# Patient Record
Sex: Male | Born: 1960 | Race: White | Hispanic: No | Marital: Married | Smoking: Never smoker
Health system: Southern US, Community
[De-identification: ages and names within clinical notes are randomized; demographics above are authoritative.]

## PROBLEM LIST (undated history)

## (undated) DIAGNOSIS — Z973 Presence of spectacles and contact lenses: Secondary | ICD-10-CM

## (undated) DIAGNOSIS — I10 Essential (primary) hypertension: Secondary | ICD-10-CM

## (undated) HISTORY — PX: HERNIA REPAIR: SHX51

## (undated) HISTORY — DX: Essential (primary) hypertension: I10

## (undated) HISTORY — PX: FRACTURE SURGERY: SHX138

---

## 1992-08-28 HISTORY — PX: WISDOM TOOTH EXTRACTION: SHX21

## 2014-01-11 ENCOUNTER — Emergency Department (HOSPITAL_COMMUNITY): Payer: Worker's Compensation

## 2014-01-11 ENCOUNTER — Emergency Department (HOSPITAL_COMMUNITY)
Admission: EM | Admit: 2014-01-11 | Discharge: 2014-01-11 | Disposition: A | Discharge status: Home / Self Care | Payer: Worker's Compensation | Attending: Emergency Medicine | Admitting: Emergency Medicine

## 2014-01-11 ENCOUNTER — Encounter (HOSPITAL_COMMUNITY): Payer: Self-pay | Admitting: Emergency Medicine

## 2014-01-11 DIAGNOSIS — Y929 Unspecified place or not applicable: Secondary | ICD-10-CM | POA: Insufficient documentation

## 2014-01-11 DIAGNOSIS — S82899A Other fracture of unspecified lower leg, initial encounter for closed fracture: Secondary | ICD-10-CM

## 2014-01-11 DIAGNOSIS — R296 Repeated falls: Secondary | ICD-10-CM | POA: Insufficient documentation

## 2014-01-11 DIAGNOSIS — Y9389 Activity, other specified: Secondary | ICD-10-CM | POA: Insufficient documentation

## 2014-01-11 DIAGNOSIS — R110 Nausea: Secondary | ICD-10-CM | POA: Insufficient documentation

## 2014-01-11 DIAGNOSIS — R55 Syncope and collapse: Secondary | ICD-10-CM

## 2014-01-11 DIAGNOSIS — R42 Dizziness and giddiness: Secondary | ICD-10-CM | POA: Insufficient documentation

## 2014-01-11 DIAGNOSIS — I10 Essential (primary) hypertension: Secondary | ICD-10-CM

## 2014-01-11 DIAGNOSIS — I951 Orthostatic hypotension: Secondary | ICD-10-CM

## 2014-01-11 HISTORY — DX: Essential (primary) hypertension: I10

## 2014-01-11 LAB — CBC WITH DIFFERENTIAL/PLATELET
Basophils Absolute: 0.1 10*3/uL (ref 0.0–0.1)
Basophils Relative: 1 % (ref 0–1)
EOS ABS: 0.2 10*3/uL (ref 0.0–0.7)
Eosinophils Relative: 2 % (ref 0–5)
HCT: 43.8 % (ref 39.0–52.0)
Hemoglobin: 15 g/dL (ref 13.0–17.0)
LYMPHS PCT: 14 % (ref 12–46)
Lymphs Abs: 1.2 10*3/uL (ref 0.7–4.0)
MCH: 30.1 pg (ref 26.0–34.0)
MCHC: 34.2 g/dL (ref 30.0–36.0)
MCV: 87.8 fL (ref 78.0–100.0)
MONO ABS: 0.7 10*3/uL (ref 0.1–1.0)
Monocytes Relative: 8 % (ref 3–12)
Neutro Abs: 6.8 10*3/uL (ref 1.7–7.7)
Neutrophils Relative %: 75 % (ref 43–77)
Platelets: 179 10*3/uL (ref 150–400)
RBC: 4.99 MIL/uL (ref 4.22–5.81)
RDW: 12.5 % (ref 11.5–15.5)
WBC: 9 10*3/uL (ref 4.0–10.5)

## 2014-01-11 LAB — BASIC METABOLIC PANEL
BUN: 14 mg/dL (ref 6–23)
CALCIUM: 9.8 mg/dL (ref 8.4–10.5)
CO2: 23 meq/L (ref 19–32)
Chloride: 100 mEq/L (ref 96–112)
Creatinine, Ser: 0.98 mg/dL (ref 0.50–1.35)
GFR calc Af Amer: >90 mL/min (ref >90)
GLUCOSE: 110 mg/dL — AB (ref 70–99)
Potassium: 4 mEq/L (ref 3.7–5.3)
Sodium: 139 mEq/L (ref 137–147)

## 2014-01-11 LAB — I-STAT TROPONIN, ED: Troponin i, poc: 0 ng/mL (ref 0.00–0.08)

## 2014-01-11 MED ORDER — SODIUM CHLORIDE 0.9 % IV BOLUS (SEPSIS): 1000.0000 mL | Freq: Once | INTRAVENOUS | Status: DC

## 2014-01-11 MED ORDER — HYDROCODONE-ACETAMINOPHEN 5-325 MG PO TABS: 1.0000 | ORAL_TABLET | Freq: Four times a day (QID) | ORAL | Status: DC | PRN

## 2014-01-11 NOTE — ED Notes (Signed)
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

## 2014-01-11 NOTE — Discharge Instructions (Signed)
Ankle Fracture A fracture is a break in the bone. A cast or splint is used to protect and keep your injured bone from moving.  HOME CARE INSTRUCTIONS   Use your crutches as directed.  To lessen the swelling, keep the injured leg elevated while sitting or lying down.  Apply ice to the injury for 15-20 minutes, 03-04 times per day while awake for 2 days. Put the ice in a plastic bag and place a thin towel between the bag of ice and your cast.  If you have a plaster or fiberglass cast:  Do not try to scratch the skin under the cast using sharp or pointed objects.  Check the skin around the cast every day. You may put lotion on any red or sore areas.  Keep your cast dry and clean.  If you have a plaster splint:  Wear the splint as directed.  You may loosen the elastic around the splint if your toes become numb, tingle, or turn cold or blue.  Do not put pressure on any part of your cast or splint; it may break. Rest your cast only on a pillow the first 24 hours until it is fully hardened.  Your cast or splint can be protected during bathing with a plastic bag. Do not lower the cast or splint into water.  Take medications as directed by your caregiver. Only take over-the-counter or prescription medicines for pain, discomfort, or fever as directed by your caregiver.  Do not drive a vehicle until your caregiver specifically tells you it is safe to do so.  If your caregiver has given you a follow-up appointment, it is very important to keep that appointment. Not keeping the appointment could result in a chronic or permanent injury, pain, and disability. If there is any problem keeping the appointment, you must call back to this facility for assistance. SEEK IMMEDIATE MEDICAL CARE IF:   Your cast gets damaged or breaks.  You have continued severe pain or more swelling than you did before the cast was put on.  Your skin or toenails below the injury turn blue or gray, or feel cold or  numb.  There is a bad smell or new stains and/or purulent (pus like) drainage coming from under the cast. If you do not have a window in your cast for observing the wound, a discharge or minor bleeding may show up as a stain on the outside of your cast. Report these findings to your caregiver. MAKE SURE YOU:   Understand these instructions.  Will watch your condition.  Will get help right away if you are not doing well or get worse. Document Released: 08/11/2000 Document Revised: 11/06/2011 Document Reviewed: 03/13/2013 [facility] Patient Information 2014 [place], [place]. Orthostasis Orthostasis is a sudden fall in blood pressure or rise in heartrate. It occurs when a person goes from a sitting or lying position to a standing position. CAUSES   Loss of body fluids (dehydration).  Medicines that lower blood pressure.  Sudden changes in posture, such as sudden standing when you have been sitting or lying down.  Taking too much of your medicine. SYMPTOMS   Lightheadedness or dizziness.  Fainting or near-fainting.  A fast heart rate (tachycardia).  Weakness.  Feeling tired (fatigue). DIAGNOSIS  Your caregiver may find the cause of orthostatic hypotension through:  A history and/or physical exam.  Checking your blood pressure. Your caregiver will check your blood pressure when you are:  Lying down.  Sitting.  Standing.  Tilt table  testing. In this test, you are placed on a table that goes from a lying position to a standing position. You will be strapped to the table. This test helps to monitor your blood pressure and heart rate when you are in different positions. TREATMENT   If orthostatic hypotension is caused by your medicines, your caregiver will need to adjust your dosage. Do not stop or adjust your medicine on your own.  When changing positions, make these changes slowly. This allows your body to adjust to the different position.  Compression stockings that are  worn on your lower legs may be helpful.  Your caregiver may have you consume extra salt. Do not add extra salt to your diet unless directed by your caregiver.  Eat frequent, small meals. Avoid sudden standing after eating.  Avoid hot showers or excessive heat.  Your caregiver may give you fluids through the vein (intravenous).  Your caregiver may put you on medicine to help enhance fluid retention. SEEK IMMEDIATE MEDICAL CARE IF:   You faint or have a near-fainting episode. Call your local emergency services (911 in [place]  You have or develop chest pain.  You feel sick to your stomach (nauseous) or vomit.  You have a loss of feeling or movement in your arms or legs.  You have difficulty talking, slurred speech, or you are unable to talk.  You have difficulty thinking or have confused thinking. MAKE SURE YOU:   Understand these instructions.  Will watch your condition.  Will get help right away if you are not doing well or get worse. Document Released: 08/04/2002 Document Revised: 11/06/2011 Document Reviewed: 11/27/2008 [facility] Patient Information 2014 [place], [place].

## 2014-01-11 NOTE — ED Notes (Signed)
Per pt he stood up from a sitting position and had LOC, pt states he remembers waking up "Face down on the floor."  Pt states nothing like this has ever happened prior to tonight.  Pt has deformity and edema to his left ankle.  Pt rates pain 7/10.

## 2014-01-11 NOTE — ED Provider Notes (Signed)
CSN: [id number]     Arrival date & time 01/11/14  0522 History   First MD Initiated Contact with Patient 01/11/14 0544     Chief Complaint  Patient presents with  . Ankle Injury  . Loss of Consciousness     (Consider location/radiation/quality/duration/timing/severity/associated sxs/prior Treatment) HPI  This a 53 year old male who presents with left ankle pain. Patient states that he went from a sitting to standing position and thinks he passed out. He does not member the event was unwitnessed. He states he felt little nauseous before he stood up. He denies any shortness of breath or chest pain. He does work nights and has felt more fatigued. He denies any dehydration. He states that when he woke up he had deformity and pain to the left ankle.  Patient denies any prior episodes of syncope. He denies any history of seizure. Currently the rates his pain at 7/10. He does not think he hit his head and denies any headache. He is not on any anticoagulants. No known history of heart disease or valvular disease.  History reviewed. No pertinent past medical history. Past Surgical History  Procedure Laterality Date  . Hernia repair      When pt was 53 years old   No family history on file. History  Substance Use Topics  . Smoking status: Never Smoker   . Smokeless tobacco: Never Used  . Alcohol Use: No    Review of Systems  Constitutional: Negative.  Negative for fever.  Respiratory: Negative.  Negative for chest tightness and shortness of breath.   Cardiovascular: Negative.  Negative for chest pain.  Gastrointestinal: Negative.  Negative for abdominal pain.  Genitourinary: Negative.  Negative for dysuria.  Musculoskeletal: Negative for back pain.  Skin: Negative for rash.  Neurological: Positive for dizziness and syncope. Negative for weakness, numbness and headaches.  All other systems reviewed and are negative.     Allergies  Review of patient's allergies indicates no known  allergies.  Home Medications   Prior to Admission medications   Not on File   BP 139/89  Pulse 57  Temp(Src) 97.9 F (36.6 C) (Oral)  Resp 20  Ht 6\' 2"  (1.88 m)  Wt 235 lb (106.595 kg)  BMI 30.16 kg/m2  SpO2 97% Physical Exam  Nursing note and vitals reviewed. Constitutional: He is oriented to person, place, and time. He appears well-developed and well-nourished.  HENT:  Head: Normocephalic and atraumatic.  Eyes: Pupils are equal, round, and reactive to light.  Neck: Neck supple.  Cardiovascular: Normal rate and regular rhythm.   Pulmonary/Chest: Effort normal. No respiratory distress.  Musculoskeletal: He exhibits no edema.  Diffuse swelling left ankle, 2+ DP pulse, limited range of motion secondary to pain, no obvious deformity, no proximal fibular tenderness  Lymphadenopathy:    He has no cervical adenopathy.  Neurological: He is alert and oriented to person, place, and time.  Cranial nerves II through XII intact, 5/5 extremities, no dysmetria to finger-nose-finger  Skin: Skin is warm and dry.  Psychiatric: He has a normal mood and affect.    ED Course  Procedures (including critical care time) Labs Review Labs Reviewed  BASIC METABOLIC PANEL - Abnormal; Notable for the following:    Glucose, Bld 110 (*)    All other components within normal limits  CBC WITH DIFFERENTIAL  I-STAT TROPOININ, ED    Imaging Review Dg Ankle Complete Left  01/11/2014   CLINICAL DATA:  Left ankle pain following a fall today.  EXAM:  LEFT ANKLE COMPLETE - 3+ VIEW  COMPARISON:  None.  FINDINGS: Oblique fracture through the proximal lateral malleolus with 1/3 shaft width of posterior displacement and mild posterior angulation of the distal fragment. There is also widening of the space between the medial malleolus and talus. Associated soft tissue swelling and effusion.  IMPRESSION: Lateral malleolus fracture and distal tibia subluxation, as described above.   Electronically Signed   By: Enrique Sack M.D.   On: 01/11/2014 06:20   Ct Head Wo Contrast  01/11/2014   CLINICAL DATA:  Syncope post trauma  EXAM: CT HEAD WITHOUT CONTRAST  TECHNIQUE: Contiguous axial images were obtained from the base of the skull through the vertex without intravenous contrast.  COMPARISON:  None.  FINDINGS: Ventricles are normal in size and configuration. There is no mass, hemorrhage, extra-axial fluid collection, or midline shift. Gray-white compartments appear normal. No acute infarct apparent. Bony calvarium appears intact. The mastoid air cells are clear.  IMPRESSION: Study within normal limits.   Electronically Signed   By: Lowella Grip M.D.   On: 01/11/2014 08:29     EKG Interpretation   Date/Time:  Sunday Jan 11 2014 05:49:45 EDT Ventricular Rate:  57 PR Interval:  152 QRS Duration: 89 QT Interval:  421 QTC Calculation: 410 R Axis:   12 Text Interpretation:  Sinus rhythm No prior for comparison Confirmed by  HORTON  MD, COURTNEY ([id number]) on 01/11/2014 7:30:41 AM      MDM   Final diagnoses:  Orthostasis  Syncope  Ankle fracture    Patient with reported syncope and left ankle injury.  +orthostasis. EKG reassuring and other work-up unremarkable.  CT head neg.  Patient noted to have right ankle fracture with joint space widening.  Discussed with Dr. Tonita Cong.  Will split and patient to follow-up tomorrow.  GIven orthostasis, patient given fluids.  Suspect this as cause of syncope given story and w/u.  After history, exam, and medical workup I feel the patient has been appropriately medically screened and is safe for discharge home. Pertinent diagnoses were discussed with the patient. Patient was given return precautions.     Merryl Hacker, MD 01/11/14 [phone]

## 2014-01-11 NOTE — ED Provider Notes (Signed)
8:27 AM  Assumed care from Dr. Dina Rich.  Pt is a 53 y.o. M with no PMH who had a syncopal event after standing up from a seated position and sustained a left malleolus fracture and distal tibia subluxation. He has been splinted. His labs have been unremarkable. EKG shows no ischemic changes or arrhythmia. He is orthostatic and receiving IV fluids. CT head pending. Anticipate discharge home.   8:36 AM  Pt's head CT is negative. He is neurologically intact, well-appearing, hemodynamically stable. Patient has asked if he can be discharged without receiving IV fluids. Discussed with patient that he is orthostatic and we recommend IV fluids given his syncopal event but he states he would rather go home and drink water, Gatorade. Have discussed strict return precautions and supportive care instructions. Patient does not become lightheaded with standing and has been able to a bili on crutches. I feel this to be a safe to go home and since he can't tolerate by mouth fluids at this is also a option for rehydration. Patient and wife at bedside are comfortable with this plan.  [place], DO 01/11/14 [phone]

## 2014-01-16 ENCOUNTER — Encounter (HOSPITAL_BASED_OUTPATIENT_CLINIC_OR_DEPARTMENT_OTHER): Payer: Self-pay | Admitting: *Deleted

## 2014-01-16 NOTE — Progress Notes (Signed)
Labs and ekg done er-01/11/14-no meds

## 2014-01-21 ENCOUNTER — Other Ambulatory Visit: Payer: Self-pay | Admitting: Orthopedic Surgery

## 2014-01-22 ENCOUNTER — Encounter (HOSPITAL_BASED_OUTPATIENT_CLINIC_OR_DEPARTMENT_OTHER): Payer: 59 | Admitting: Certified Registered"

## 2014-01-22 ENCOUNTER — Encounter (HOSPITAL_BASED_OUTPATIENT_CLINIC_OR_DEPARTMENT_OTHER)
Admission: RE | Disposition: A | Discharge status: Home / Self Care | Payer: Self-pay | Source: Ambulatory Visit | Attending: Orthopedic Surgery

## 2014-01-22 ENCOUNTER — Ambulatory Visit (HOSPITAL_BASED_OUTPATIENT_CLINIC_OR_DEPARTMENT_OTHER)
Admission: RE | Admit: 2014-01-22 | Discharge: 2014-01-22 | Disposition: A | Discharge status: Home / Self Care | Payer: 59 | Source: Ambulatory Visit | Attending: Orthopedic Surgery | Admitting: Orthopedic Surgery

## 2014-01-22 ENCOUNTER — Ambulatory Visit (HOSPITAL_BASED_OUTPATIENT_CLINIC_OR_DEPARTMENT_OTHER): Payer: 59 | Admitting: Certified Registered"

## 2014-01-22 ENCOUNTER — Encounter (HOSPITAL_BASED_OUTPATIENT_CLINIC_OR_DEPARTMENT_OTHER): Payer: Self-pay | Admitting: Certified Registered"

## 2014-01-22 DIAGNOSIS — Y9269 Other specified industrial and construction area as the place of occurrence of the external cause: Secondary | ICD-10-CM | POA: Diagnosis not present

## 2014-01-22 DIAGNOSIS — S8263XA Displaced fracture of lateral malleolus of unspecified fibula, initial encounter for closed fracture: Secondary | ICD-10-CM | POA: Insufficient documentation

## 2014-01-22 DIAGNOSIS — IMO0001 Reserved for inherently not codable concepts without codable children: Secondary | ICD-10-CM

## 2014-01-22 DIAGNOSIS — Y990 Civilian activity done for income or pay: Secondary | ICD-10-CM | POA: Insufficient documentation

## 2014-01-22 DIAGNOSIS — X58XXXA Exposure to other specified factors, initial encounter: Secondary | ICD-10-CM | POA: Diagnosis not present

## 2014-01-22 HISTORY — PX: ORIF ANKLE FRACTURE: SHX5408

## 2014-01-22 HISTORY — DX: Presence of spectacles and contact lenses: Z97.3

## 2014-01-22 LAB — POCT HEMOGLOBIN-HEMACUE: Hemoglobin: 15.5 g/dL (ref 13.0–17.0)

## 2014-01-22 SURGERY — OPEN REDUCTION INTERNAL FIXATION (ORIF) ANKLE FRACTURE
Anesthesia: Regional | Site: Ankle | Laterality: Left

## 2014-01-22 MED ORDER — HYDROCODONE-ACETAMINOPHEN 5-325 MG PO TABS: 1.0000 | ORAL_TABLET | Freq: Four times a day (QID) | ORAL | Status: DC | PRN

## 2014-01-22 MED ORDER — MIDAZOLAM HCL 2 MG/2ML IJ SOLN
1.0000 mg | INTRAMUSCULAR | Status: DC | PRN
  Administered 2014-01-22: 2 mg via INTRAVENOUS

## 2014-01-22 MED ORDER — FENTANYL CITRATE 0.05 MG/ML IJ SOLN
INTRAMUSCULAR | Status: AC
  Filled 2014-01-22: qty 6

## 2014-01-22 MED ORDER — ACETAMINOPHEN 500 MG PO TABS
1000.0000 mg | ORAL_TABLET | Freq: Once | ORAL | Status: AC
  Administered 2014-01-22: 1000 mg via ORAL

## 2014-01-22 MED ORDER — CEFAZOLIN SODIUM-DEXTROSE 2-3 GM-% IV SOLR
2.0000 g | INTRAVENOUS | Status: AC
  Administered 2014-01-22: 2 g via INTRAVENOUS

## 2014-01-22 MED ORDER — OXYCODONE HCL 5 MG PO TABS: 5.0000 mg | ORAL_TABLET | Freq: Once | ORAL | Status: DC | PRN

## 2014-01-22 MED ORDER — ONDANSETRON HCL 4 MG/2ML IJ SOLN
INTRAMUSCULAR | Status: DC | PRN
  Administered 2014-01-22: 4 mg via INTRAVENOUS

## 2014-01-22 MED ORDER — MIDAZOLAM HCL 2 MG/2ML IJ SOLN
INTRAMUSCULAR | Status: AC
  Filled 2014-01-22: qty 2

## 2014-01-22 MED ORDER — 0.9 % SODIUM CHLORIDE (POUR BTL) OPTIME
TOPICAL | Status: DC | PRN
  Administered 2014-01-22: 200 mL

## 2014-01-22 MED ORDER — LIDOCAINE HCL (CARDIAC) 20 MG/ML IV SOLN
INTRAVENOUS | Status: DC | PRN
  Administered 2014-01-22: 30 mg via INTRAVENOUS

## 2014-01-22 MED ORDER — PROPOFOL 10 MG/ML IV BOLUS
INTRAVENOUS | Status: DC | PRN
  Administered 2014-01-22: 200 mg via INTRAVENOUS

## 2014-01-22 MED ORDER — ACETAMINOPHEN 500 MG PO TABS
ORAL_TABLET | ORAL | Status: AC
  Filled 2014-01-22: qty 2

## 2014-01-22 MED ORDER — HYDROMORPHONE HCL PF 1 MG/ML IJ SOLN: 0.2500 mg | INTRAMUSCULAR | Status: DC | PRN

## 2014-01-22 MED ORDER — CHLORHEXIDINE GLUCONATE 4 % EX LIQD: 60.0000 mL | Freq: Once | CUTANEOUS | Status: DC

## 2014-01-22 MED ORDER — SODIUM CHLORIDE 0.9 % IV SOLN: INTRAVENOUS | Status: DC

## 2014-01-22 MED ORDER — FENTANYL CITRATE 0.05 MG/ML IJ SOLN
INTRAMUSCULAR | Status: AC
  Filled 2014-01-22: qty 2

## 2014-01-22 MED ORDER — CEFAZOLIN SODIUM-DEXTROSE 2-3 GM-% IV SOLR
INTRAVENOUS | Status: AC
  Filled 2014-01-22: qty 50

## 2014-01-22 MED ORDER — MIDAZOLAM HCL 5 MG/5ML IJ SOLN
INTRAMUSCULAR | Status: DC | PRN
  Administered 2014-01-22: 2 mg via INTRAVENOUS

## 2014-01-22 MED ORDER — LACTATED RINGERS IV SOLN
INTRAVENOUS | Status: DC
  Administered 2014-01-22 (×2): via INTRAVENOUS
  Administered 2014-01-22: 10 mL/h via INTRAVENOUS

## 2014-01-22 MED ORDER — BACITRACIN ZINC 500 UNIT/GM EX OINT
TOPICAL_OINTMENT | CUTANEOUS | Status: AC
  Filled 2014-01-22: qty 28.35

## 2014-01-22 MED ORDER — BUPIVACAINE HCL (PF) 0.5 % IJ SOLN
INTRAMUSCULAR | Status: DC | PRN
  Administered 2014-01-22: 15 mL

## 2014-01-22 MED ORDER — MIDAZOLAM HCL 2 MG/2ML IJ SOLN
INTRAMUSCULAR | Status: AC
Start: 2014-01-22 — End: 2014-01-22
  Filled 2014-01-22: qty 2

## 2014-01-22 MED ORDER — DEXAMETHASONE SODIUM PHOSPHATE 10 MG/ML IJ SOLN
INTRAMUSCULAR | Status: DC | PRN
  Administered 2014-01-22: 10 mg via INTRAVENOUS

## 2014-01-22 MED ORDER — BUPIVACAINE-EPINEPHRINE (PF) 0.5% -1:200000 IJ SOLN
INTRAMUSCULAR | Status: AC
Start: 2014-01-22 — End: 2014-01-22
  Filled 2014-01-22: qty 30

## 2014-01-22 MED ORDER — BUPIVACAINE-EPINEPHRINE (PF) 0.5% -1:200000 IJ SOLN
INTRAMUSCULAR | Status: DC | PRN
  Administered 2014-01-22: 30 mL

## 2014-01-22 MED ORDER — OXYCODONE HCL 5 MG/5ML PO SOLN: 5.0000 mg | Freq: Once | ORAL | Status: DC | PRN

## 2014-01-22 MED ORDER — BACITRACIN ZINC 500 UNIT/GM EX OINT
TOPICAL_OINTMENT | CUTANEOUS | Status: DC | PRN
  Administered 2014-01-22: 1 via TOPICAL

## 2014-01-22 MED ORDER — FENTANYL CITRATE 0.05 MG/ML IJ SOLN
50.0000 ug | INTRAMUSCULAR | Status: DC | PRN
  Administered 2014-01-22: 100 ug via INTRAVENOUS

## 2014-01-22 SURGICAL SUPPLY — 77 items
BANDAGE ESMARK 6X9 LF (GAUZE/BANDAGES/DRESSINGS) ×1 IMPLANT
BIT DRILL 2.5X2.75 QC CALB (BIT) ×2 IMPLANT
BIT DRILL 3.5X5.5 QC CALB (BIT) ×2 IMPLANT
BLADE 15 SAFETY STRL DISP (BLADE) IMPLANT
BLADE SURG 15 STRL LF DISP TIS (BLADE) ×2 IMPLANT
BLADE SURG 15 STRL SS (BLADE) ×2
BNDG COHESIVE 4X5 TAN STRL (GAUZE/BANDAGES/DRESSINGS) ×2 IMPLANT
BNDG COHESIVE 6X5 TAN STRL LF (GAUZE/BANDAGES/DRESSINGS) ×2 IMPLANT
BNDG ESMARK 4X9 LF (GAUZE/BANDAGES/DRESSINGS) IMPLANT
BNDG ESMARK 6X9 LF (GAUZE/BANDAGES/DRESSINGS) ×2
CANISTER SUCT 1200ML W/VALVE (MISCELLANEOUS) ×2 IMPLANT
CHLORAPREP W/TINT 26ML (MISCELLANEOUS) ×2 IMPLANT
COVER TABLE BACK 60X90 (DRAPES) ×2 IMPLANT
CUFF TOURNIQUET SINGLE 34IN LL (TOURNIQUET CUFF) ×2 IMPLANT
DECANTER SPIKE VIAL GLASS SM (MISCELLANEOUS) IMPLANT
DRAPE C-ARM 42X72 X-RAY (DRAPES) IMPLANT
DRAPE C-ARMOR (DRAPES) IMPLANT
DRAPE EXTREMITY T 121X128X90 (DRAPE) ×2 IMPLANT
DRAPE OEC MINIVIEW 54X84 (DRAPES) ×2 IMPLANT
DRAPE U-SHAPE 47X51 STRL (DRAPES) ×2 IMPLANT
DRSG ADAPTIC 3X8 NADH LF (GAUZE/BANDAGES/DRESSINGS) IMPLANT
DRSG EMULSION OIL 3X3 NADH (GAUZE/BANDAGES/DRESSINGS) ×2 IMPLANT
DRSG PAD ABDOMINAL 8X10 ST (GAUZE/BANDAGES/DRESSINGS) ×4 IMPLANT
ELECT REM PT RETURN 9FT ADLT (ELECTROSURGICAL) ×2
ELECTRODE REM PT RTRN 9FT ADLT (ELECTROSURGICAL) ×1 IMPLANT
GAUZE SPONGE 4X4 12PLY STRL (GAUZE/BANDAGES/DRESSINGS) ×2 IMPLANT
GLOVE BIO SURGEON STRL SZ8 (GLOVE) ×2 IMPLANT
GLOVE BIOGEL PI IND STRL 7.0 (GLOVE) ×2 IMPLANT
GLOVE BIOGEL PI IND STRL 8 (GLOVE) ×1 IMPLANT
GLOVE BIOGEL PI INDICATOR 7.0 (GLOVE) ×2
GLOVE BIOGEL PI INDICATOR 8 (GLOVE) ×1
GLOVE ECLIPSE 6.5 STRL STRAW (GLOVE) ×4 IMPLANT
GLOVE EXAM NITRILE MD LF STRL (GLOVE) ×2 IMPLANT
GOWN STRL REUS W/ TWL LRG LVL3 (GOWN DISPOSABLE) ×2 IMPLANT
GOWN STRL REUS W/ TWL XL LVL3 (GOWN DISPOSABLE) ×1 IMPLANT
GOWN STRL REUS W/TWL LRG LVL3 (GOWN DISPOSABLE) ×2
GOWN STRL REUS W/TWL XL LVL3 (GOWN DISPOSABLE) ×1
NEEDLE HYPO 22GX1.5 SAFETY (NEEDLE) IMPLANT
NS IRRIG 1000ML POUR BTL (IV SOLUTION) ×2 IMPLANT
PACK BASIN DAY SURGERY FS (CUSTOM PROCEDURE TRAY) ×2 IMPLANT
PAD CAST 4YDX4 CTTN HI CHSV (CAST SUPPLIES) ×1 IMPLANT
PADDING CAST ABS 4INX4YD NS (CAST SUPPLIES)
PADDING CAST ABS COTTON 4X4 ST (CAST SUPPLIES) IMPLANT
PADDING CAST COTTON 4X4 STRL (CAST SUPPLIES) ×1
PADDING CAST COTTON 6X4 STRL (CAST SUPPLIES) ×2 IMPLANT
PENCIL BUTTON HOLSTER BLD 10FT (ELECTRODE) ×2 IMPLANT
PLATE ACE 100DEG 6HOLE (Plate) ×2 IMPLANT
SANITIZER HAND PURELL 535ML FO (MISCELLANEOUS) ×2 IMPLANT
SCREW CORTICAL 3.5MM  16MM (Screw) ×1 IMPLANT
SCREW CORTICAL 3.5MM  20MM (Screw) ×3 IMPLANT
SCREW CORTICAL 3.5MM 14MM (Screw) ×2 IMPLANT
SCREW CORTICAL 3.5MM 16MM (Screw) ×1 IMPLANT
SCREW CORTICAL 3.5MM 18MM (Screw) ×2 IMPLANT
SCREW CORTICAL 3.5MM 20MM (Screw) ×3 IMPLANT
SCREW CORTICAL 3.5MM 24MM (Screw) ×2 IMPLANT
SHEET MEDIUM DRAPE 40X70 STRL (DRAPES) ×2 IMPLANT
SLEEVE SCD COMPRESS KNEE MED (MISCELLANEOUS) ×2 IMPLANT
SPLINT FAST PLASTER 5X30 (CAST SUPPLIES) ×20
SPLINT PLASTER CAST FAST 5X30 (CAST SUPPLIES) ×20 IMPLANT
SPONGE LAP 18X18 X RAY DECT (DISPOSABLE) ×2 IMPLANT
STOCKINETTE 6  STRL (DRAPES) ×1
STOCKINETTE 6 STRL (DRAPES) ×1 IMPLANT
SUCTION FRAZIER TIP 10 FR DISP (SUCTIONS) ×2 IMPLANT
SUT ETHILON 3 0 PS 1 (SUTURE) ×2 IMPLANT
SUT FIBERWIRE #2 38 T-5 BLUE (SUTURE)
SUT MNCRL AB 3-0 PS2 18 (SUTURE) ×2 IMPLANT
SUT VIC AB 0 SH 27 (SUTURE) IMPLANT
SUT VIC AB 2-0 SH 27 (SUTURE) ×1
SUT VIC AB 2-0 SH 27XBRD (SUTURE) ×1 IMPLANT
SUT VICRYL 4-0 PS2 18IN ABS (SUTURE) IMPLANT
SUTURE FIBERWR #2 38 T-5 BLUE (SUTURE) IMPLANT
SYR BULB 3OZ (MISCELLANEOUS) ×2 IMPLANT
SYR CONTROL 10ML LL (SYRINGE) IMPLANT
TOWEL OR 17X24 6PK STRL BLUE (TOWEL DISPOSABLE) ×4 IMPLANT
TOWEL OR NON WOVEN STRL DISP B (DISPOSABLE) IMPLANT
TUBE CONNECTING 20X1/4 (TUBING) ×2 IMPLANT
UNDERPAD 30X30 INCONTINENT (UNDERPADS AND DIAPERS) ×2 IMPLANT

## 2014-01-22 NOTE — H&P (Signed)
Howard Lopez is an 53 y.o. male.   Chief Complaint: left ankle injury HPI: 53 y/o male with left lateral malleolus fracture and syndesmosis disruption sustained in a work injury a couple of weeks ago.  He presents now for ORIf.  Past Medical History  Diagnosis Date  . Wears glasses     reading    Past Surgical History  Procedure Laterality Date  . Hernia repair      When pt was 53 years old  . Wisdom tooth extraction      History reviewed. No pertinent family history. Social History:  reports that he has never smoked. He has never used smokeless tobacco. He reports that he does not drink alcohol or use illicit drugs.  Allergies: No Known Allergies  Medications Prior to Admission  Medication Sig Dispense Refill  . HYDROcodone-acetaminophen (NORCO/VICODIN) 5-325 MG per tablet Take 1 tablet by mouth every 6 (six) hours as needed.  15 tablet  0    No results found for this or any previous visit (from the past 48 hour(s)). No results found.  ROS  No recent f/c/n/v/wt loss  Blood pressure 137/94, pulse 63, temperature 97.8 F (36.6 C), temperature source Oral, resp. rate 20, height 6\' 2"  (1.88 m), weight 106.595 kg (235 lb), SpO2 99.00%. Physical Exam  wn wd male in nad.  A and O x 4.  Mood and affect normal.  EOMI.  resp unlabored.  L ankle with mild swelling.  Sens to LT is intact about the ankle and foot.  2+ dp and pt pulses.  5/5 strength in PF and DF of the toes.  Assessment/Plan L ankle lateral malleolus fracture and syndesmosis disruption - to OR for ORIF.  The risks and benefits of the alternative treatment options have been discussed in detail.  The patient wishes to proceed with surgery and specifically understands risks of bleeding, infection, nerve damage, blood clots, need for additional surgery, amputation and death.   Wylene Simmer 2014/01/27, 9:35 AM

## 2014-01-22 NOTE — Progress Notes (Signed)
Assisted Dr. Ola Spurr with left, ultrasound guided, popliteal block. Side rails up, monitors on throughout procedure. See vital signs in flow sheet. Tolerated Procedure well.

## 2014-01-22 NOTE — Transfer of Care (Signed)
Immediate Anesthesia Transfer of Care Note  Patient: Howard Lopez  Procedure(s) Performed: Procedure(s): OPEN REDUCTION INTERNAL FIXATION (ORIF) LEFT ANKLE FRACTURE/SYNDESMOSIS DISRUPTION (Left)  Patient Location: PACU  Anesthesia Type:GA combined with regional for post-op pain  Level of Consciousness: awake and patient cooperative  Airway & Oxygen Therapy: Patient Spontanous Breathing and Patient connected to face mask oxygen  Post-op Assessment: Report given to PACU RN and Post -op Vital signs reviewed and stable  Post vital signs: Reviewed and stable  Complications: No apparent anesthesia complications

## 2014-01-22 NOTE — Anesthesia Postprocedure Evaluation (Signed)
  Anesthesia Post-op Note  Patient: Howard Lopez  Procedure(s) Performed: Procedure(s): OPEN REDUCTION INTERNAL FIXATION (ORIF) LEFT ANKLE FRACTURE/SYNDESMOSIS DISRUPTION (Left)  Patient Location: PACU  Anesthesia Type:General and block  Level of Consciousness: awake and alert   Airway and Oxygen Therapy: Patient Spontanous Breathing  Post-op Pain: none  Post-op Assessment: Post-op Vital signs reviewed, Patient's Cardiovascular Status Stable and Respiratory Function Stable  Post-op Vital Signs: Reviewed  Filed Vitals:   01/22/14 1137  BP:   Pulse: 77  Temp:   Resp: 11    Complications: No apparent anesthesia complications

## 2014-01-22 NOTE — Discharge Instructions (Addendum)
Wylene Simmer, MD [place]  Please read the following information regarding your care after surgery.  Medications  You only need a prescription for the narcotic pain medicine (ex. oxycodone, Percocet, Norco).  All of the other medicines listed below are available over the counter. X norco as prescribed for pain.  You may also take ibuprofen (eg. Motrin, Advil) 800 mg every 8 hours for 3 days after surgery.  Narcotic pain medicine (ex. oxycodone, Percocet, Vicodin) will cause constipation.  To prevent this problem, take the following medicines while you are taking any pain medicine. X docusate sodium (Colace) 100 mg twice a day X senna (Senokot) 2 tablets twice a day  X To help prevent blood clots, take an aspirin (325 mg) once a day for a month after surgery.  You should also get up every hour while you are awake to move around.    Weight Bearing ? Bear weight when you are able on your operated leg or foot. ? Bear weight only on the heel of your operated foot in the post-op shoe. X Do not bear any weight on the operated leg or foot.  Cast / Splint / Dressing X Keep your splint or cast clean and dry.  Dont put anything (coat hanger, pencil, etc) down inside of it.  If it gets damp, use a hair dryer on the cool setting to dry it.  If it gets soaked, call the office to schedule an appointment for a cast change. ? Remove your dressing 3 days after surgery and cover the incisions with dry dressings.    After your dressing, cast or splint is removed; you may shower, but do not soak or scrub the wound.  Allow the water to run over it, and then gently pat it dry.  Swelling It is normal for you to have swelling where you had surgery.  To reduce swelling and pain, keep your toes above your nose for at least 3 days after surgery.  It may be necessary to keep your foot or leg elevated for several weeks.  If it hurts, it should be elevated.  Follow Up Call my office at [phone] when  you are discharged from the hospital or surgery center to schedule an appointment to be seen two weeks after surgery.  Call my office at [phone] if you develop a fever >101.5 F, nausea, vomiting, bleeding from the surgical site or severe pain.     Post Anesthesia Home Care Instructions  Activity: Get plenty of rest for the remainder of the day. A responsible adult should stay with you for 24 hours following the procedure.  For the next 24 hours, DO NOT: -Drive a car -Paediatric nurse -Drink alcoholic beverages -Take any medication unless instructed by your physician -Make any legal decisions or sign important papers.  Meals: Start with liquid foods such as gelatin or soup. Progress to regular foods as tolerated. Avoid greasy, spicy, heavy foods. If nausea and/or vomiting occur, drink only clear liquids until the nausea and/or vomiting subsides. Call your physician if vomiting continues.  Special Instructions/Symptoms: Your throat may feel dry or sore from the anesthesia or the breathing tube placed in your throat during surgery. If this causes discomfort, gargle with warm salt water. The discomfort should disappear within 24 hours.   Regional Anesthesia Blocks  1. Numbness or the inability to move the "blocked" extremity may last from 3-48 hours after placement. The length of time depends on the medication injected and your individual response to the  medication. If the numbness is not going away after 48 hours, call your surgeon.  2. The extremity that is blocked will need to be protected until the numbness is gone and the  Strength has returned. Because you cannot feel it, you will need to take extra care to avoid injury. Because it may be weak, you may have difficulty moving it or using it. You may not know what position it is in without looking at it while the block is in effect.  3. For blocks in the legs and feet, returning to weight bearing and walking needs to be done  carefully. You will need to wait until the numbness is entirely gone and the strength has returned. You should be able to move your leg and foot normally before you try and bear weight or walk. You will need someone to be with you when you first try to ensure you do not fall and possibly risk injury.  4. Bruising and tenderness at the needle site are common side effects and will resolve in a few days.  5. Persistent numbness or new problems with movement should be communicated to the surgeon or the [place] [phone] [place] [phone]).

## 2014-01-22 NOTE — Anesthesia Procedure Notes (Addendum)
Anesthesia Regional Block:  Popliteal block  Pre-Anesthetic Checklist: ,, timeout performed, Correct Patient, Correct Site, Correct Laterality, Correct Procedure, Correct Position, site marked, Risks and benefits discussed, pre-op evaluation, post-op pain management  Laterality: Left  Prep: Maximum Sterile Barrier Precautions used and chloraprep       Needles:  Injection technique: Single-shot  Needle Type: Echogenic Stimulator Needle     Needle Length: 9cm 9 cm Needle Gauge: 21 and 21 G    Additional Needles:  Procedures: ultrasound guided (picture in chart) and nerve stimulator Popliteal block  Nerve Stimulator or Paresthesia:  Response: Peroneal,  Response: Tibial,   Additional Responses:   Narrative:  Start time: 01/22/2014 9:43 AM End time: 01/22/2014 9:55 AM Injection made incrementally with aspirations every 5 mL. Anesthesiologist: Ola Spurr, MD  Additional Notes: 2% Lidocaine skin wheel. Saphenous block with 10cc of 0.5% Bupivicaine plain.   Procedure Name: LMA Insertion Date/Time: 01/22/2014 10:10 AM Performed by: Kyleeann Cremeans Pre-anesthesia Checklist: Patient identified, Emergency Drugs available, Suction available and Patient being monitored Patient Re-evaluated:Patient Re-evaluated prior to inductionOxygen Delivery Method: Circle System Utilized Preoxygenation: Pre-oxygenation with 100% oxygen Intubation Type: IV induction Ventilation: Mask ventilation without difficulty LMA: LMA inserted LMA Size: 5.0 Number of attempts: 1 Airway Equipment and Method: bite block Placement Confirmation: positive ETCO2 Tube secured with: Tape Dental Injury: Teeth and Oropharynx as per pre-operative assessment

## 2014-01-22 NOTE — Anesthesia Preprocedure Evaluation (Addendum)
Anesthesia Evaluation  Patient identified by MRN, date of birth, ID band Patient awake    Reviewed: Allergy & Precautions, H&P , NPO status , Patient's Chart, lab work & pertinent test results  Airway Mallampati: I  TM Distance: >3 FB Neck ROM: Full    Dental no notable dental hx. (+) Teeth Intact, Dental Advisory Given   Pulmonary neg pulmonary ROS,  breath sounds clear to auscultation  Pulmonary exam normal       Cardiovascular negative cardio ROS  Rhythm:Regular Rate:Normal     Neuro/Psych negative neurological ROS  negative psych ROS   GI/Hepatic negative GI ROS, Neg liver ROS,   Endo/Other  negative endocrine ROS  Renal/GU negative Renal ROS  negative genitourinary   Musculoskeletal   Abdominal   Peds  Hematology negative hematology ROS (+)   Anesthesia Other Findings   Reproductive/Obstetrics negative OB ROS                             Anesthesia Physical Anesthesia Plan  ASA: I  Anesthesia Plan: General and Regional   Post-op Pain Management:    Induction: Intravenous  Airway Management Planned: LMA  Additional Equipment:   Intra-op Plan:   Post-operative Plan: Extubation in OR  Informed Consent: I have reviewed the patients History and Physical, chart, labs and discussed the procedure including the risks, benefits and alternatives for the proposed anesthesia with the patient or authorized representative who has indicated his/her understanding and acceptance.   Dental advisory given  Plan Discussed with: CRNA  Anesthesia Plan Comments:         Anesthesia Quick Evaluation  

## 2014-01-22 NOTE — Brief Op Note (Signed)
01/22/2014  11:03 AM  PATIENT:  Howard Lopez  53 y.o. male  PRE-OPERATIVE DIAGNOSIS:  LEFT ANKLE LATERAL MALLEOLUS FRACTURE/SYNDESMOSIS DISRUPTION  POST-OPERATIVE DIAGNOSIS:  LEFT ANKLE LATERAL MALLEOLUS FRACTURE and stable SYNDESMOSIS  Procedure(s): 1.  OPEN REDUCTION INTERNAL FIXATION (ORIF) LEFT ANKLE FRACTURE 2.  Stress exam of left ankle under fluoro 3.  AP, lateral and mortise xrays of the left ankle  SURGEON:  Wylene Simmer, MD  ASSISTANT: n/a  ANESTHESIA:   General, regional  EBL:  minimal   TOURNIQUET:   Total Tourniquet Time Documented: Thigh (Left) - 25 minutes Total: Thigh (Left) - 25 minutes   COMPLICATIONS:  None apparent  DISPOSITION:  Extubated, awake and stable to recovery.  DICTATION ID:  [id number]

## 2014-01-23 ENCOUNTER — Encounter (HOSPITAL_BASED_OUTPATIENT_CLINIC_OR_DEPARTMENT_OTHER): Payer: Self-pay | Admitting: Orthopedic Surgery

## 2014-01-23 NOTE — Op Note (Signed)
[name], Lopez            ACCOUNT NO.:  [id number]  MEDICAL RECORD NO.:  [id number]  LOCATION:                                 FACILITY:  PHYSICIAN:  Howard Simmer, MD        DATE OF BIRTH:  1961/04/23  DATE OF PROCEDURE:  01/22/2014 DATE OF DISCHARGE:                              OPERATIVE REPORT   PREOPERATIVE DIAGNOSES: 1. Left ankle lateral malleolus fracture. 2. Left ankle possible syndesmosis disruption.  POSTOPERATIVE DIAGNOSES: 1. Left ankle lateral malleolus fracture. 2. Stable left ankle syndesmosis.  PROCEDURE: 1. Open reduction and internal fixation of left ankle lateral     malleolus fracture. 2. Stress examination of the left ankle under fluoroscopy. 3. AP, lateral, and mortise radiographs of the left ankle.  SURGEON:  Howard Simmer, MD  ANESTHESIA:  General, regional.  ESTIMATED BLOOD LOSS:  Minimal.  TOURNIQUET TIME:  25 minutes at 220 mmHg.  COMPLICATIONS:  None apparent.  DISPOSITION:  Extubated awake and stable to recovery.  INDICATIONS FOR PROCEDURE:  The patient is a 53 year old male who injured his left ankle at work a couple weeks ago.  He has a displaced lateral malleolus fracture and possible disruption of the syndesmosis. He presents now for operative treatment of this painful unstable ankle fracture.  He understands the risks and benefits of the alternative treatment options and elects surgical treatment.  He specifically understands risks of bleeding, infection, nerve damage, blood clots, need for additional surgery, amputation, and death.  PROCEDURE IN DETAIL:  After preoperative consent was obtained and the correct operative site was identified, the patient was brought to the operating room and placed supine on the operating table.  General anesthesia was induced.  Preoperative antibiotics were administered. Surgical time-out was taken.  Left extremity was prepped and draped in standard sterile fashion with tourniquet around the  thigh.  The extremity was exsanguinated and tourniquet was inflated to 220 mmHg.  A longitudinal incision was made over the lateral malleolus.  Sharp dissection was carried down through skin and subcutaneous tissue.  The fracture site was identified.  It was cleaned of all hematoma and irrigated copiously.  It was reduced and held with a lobster claw and a tenaculum.  A 3.5-mm fully-threaded lag screw was inserted from anterior to posterior across the fracture site.  It was noted to have excellent purchase.  A 6-hole 1/3 tubular plate was then contoured to fit the lateral malleolus.  It was fixed proximally with 3 bicortical screws and distally with 3 unicortical screws.  AP, mortise, and lateral radiographs confirmed appropriate reduction of the fracture and appropriate position and length of all hardware.  A mortise view was then obtained.  Under live fluoroscopy, dorsiflexion and external rotation stress was applied.  There was no widening of the ankle mortise or medial clear space noted.  Lateral wound was then irrigated copiously.  Inverted simple sutures of 2-0 Vicryl were used to close the deep subcutaneous tissues.  Running 3- 0 nylon was used to close the skin incision.  Sterile dressings were applied followed by well-padded short-leg splint.  Tourniquet was released at 25 minutes.  The patient was awakened from anesthesia and transported to the  recovery room in stable condition.  FOLLOWUP PLAN:  The patient will be nonweightbearing on the left lower extremity.  He will follow up with me in 2-3 weeks for suture removal and conversion to a cast.  He will take aspirin for DVT prophylaxis.  RADIOGRAPHS:  AP, lateral, and mortise radiographs of the left ankle are obtained intraoperatively.  These show interval internal fixation of the left ankle fracture.  Hardware is appropriately positioned and of the appropriate length.     Howard Simmer, MD     JH/MEDQ  D:  01/22/2014   T:  01/23/2014  Job:  [id number]

## 2014-02-20 ENCOUNTER — Encounter (INDEPENDENT_AMBULATORY_CARE_PROVIDER_SITE_OTHER): Payer: Self-pay

## 2014-02-20 ENCOUNTER — Ambulatory Visit (INDEPENDENT_AMBULATORY_CARE_PROVIDER_SITE_OTHER): Payer: 59 | Admitting: Adult Health

## 2014-02-20 ENCOUNTER — Encounter: Payer: Self-pay | Admitting: Adult Health

## 2014-02-20 VITALS — BP 120/72 | HR 65 | Temp 97.7°F | Resp 14 | Ht 74.0 in | Wt 230.0 lb

## 2014-02-20 DIAGNOSIS — R55 Syncope and collapse: Secondary | ICD-10-CM | POA: Insufficient documentation

## 2014-02-20 DIAGNOSIS — R1013 Epigastric pain: Secondary | ICD-10-CM | POA: Insufficient documentation

## 2014-02-20 DIAGNOSIS — Z23 Encounter for immunization: Secondary | ICD-10-CM | POA: Insufficient documentation

## 2014-02-20 LAB — HEPATIC FUNCTION PANEL
ALT: 38 U/L (ref 0–53)
AST: 27 U/L (ref 0–37)
Albumin: 4.4 g/dL (ref 3.5–5.2)
Alkaline Phosphatase: 74 U/L (ref 39–117)
BILIRUBIN DIRECT: 0.1 mg/dL (ref 0.0–0.3)
Total Bilirubin: 0.5 mg/dL (ref 0.2–1.2)
Total Protein: 7.7 g/dL (ref 6.0–8.3)

## 2014-02-20 LAB — HEMOGLOBIN A1C: HEMOGLOBIN A1C: 5.5 % (ref 4.6–6.5)

## 2014-02-20 LAB — TSH: TSH: 1.92 u[IU]/mL (ref 0.35–4.50)

## 2014-02-20 NOTE — Patient Instructions (Signed)
   Thank you for choosing [facility] for your health care needs.  Please have your labs drawn prior to leaving the office.  The results will be available through MyChart for your convenience. Please remember to activate this. The activation code is located at the end of this form.  You received your Tdap vaccine in clinic today. This is good for 10 years.  Please start Zantac 150 mg twice a day.

## 2014-02-20 NOTE — Progress Notes (Signed)
Pre visit review using our clinic review tool, if applicable. No additional management support is needed unless otherwise documented below in the visit note. 

## 2014-02-20 NOTE — Progress Notes (Signed)
Subjective:    Patient ID: Howard Lopez, male    DOB: 11-28-1960, 53 y.o.   MRN: [id number]  HPI  Pleasant 53 yo caucasian male presents today to establish care. Has not been previously followed by a PCP. In April began bouts of nausea upon waking in the morning. Continued on/off for a couple months and on 5/17 passed out during work falling and fracturing his fibula. Seen at [facility] where EKG, Head CT scan, and blood work were normal. Had another episode of passing out on the way home from the hospital that lasted a reported 20 seconds. Prior to he feels burning pain in epigastric, upper right abdomen and tingling.  Recently had ORIF of left fibula. Has been able to go to the gym without syncope or near syncope, chest pain or lightheadedness but is concerned about driving. Last bout of nausea was this morning following breakfast. He has not taken any medication. Denies hx of gastritis, GERD, ulcers. Has not associated symptoms with eating or lack thereof. However, he does report symptoms upon first awakening and fasting.  Prevention - Does not recall last tetanus, never had screening colonoscopy, last eye exam 5 years ago, last dental exam in March 2015.   Past Medical History  Diagnosis Date  . Wears glasses     reading  . Hypertension 01/11/14    @ [facility] [place]    No current outpatient prescriptions on file prior to visit.   No current facility-administered medications on file prior to visit.    Review of Systems  Constitutional: Negative.   HENT: Negative.   Eyes: Negative.   Respiratory: Negative.   Cardiovascular: Negative.   Gastrointestinal: Positive for nausea and abdominal pain (RUQ).  Endocrine: Negative.   Genitourinary: Negative.   Musculoskeletal: Negative.   Allergic/Immunologic: Negative.   Neurological: Negative.   Hematological: Negative.   Psychiatric/Behavioral: Negative.        Objective:  BP 120/72  Pulse 65  Temp(Src) 97.7 F (36.5 C) (Oral)  Resp 14  Ht  6\' 2"  (1.88 m)  Wt 230 lb (104.327 kg)  BMI 29.52 kg/m2  SpO2 98%   Physical Exam  Constitutional: He is oriented to person, place, and time. He appears well-developed and well-nourished. No distress.  HENT:  Head: Normocephalic.  Eyes: Conjunctivae and EOM are normal. Pupils are equal, round, and reactive to light.  Neck: Normal range of motion. Neck supple.  Cardiovascular: Normal rate, regular rhythm, normal heart sounds and intact distal pulses.  Exam reveals no gallop and no friction rub.   No murmur heard. Pulmonary/Chest: Effort normal and breath sounds normal. No respiratory distress. He has no wheezes. He has no rales.  Abdominal: Soft. Bowel sounds are normal. There is no tenderness.  Musculoskeletal: He exhibits no edema.  Left lower leg in cast s/p ORIF. Left IWalk in use.  Neurological: He is alert and oriented to person, place, and time.  Skin: Skin is warm and dry.  Psychiatric: He has a normal mood and affect. His behavior is normal. Judgment and thought content normal.          Assessment & Plan:   1. Abdominal pain, epigastric Last bout of nausea this morning. Describes pain when occuring in RUQ. Unable to elicit pain during physical exam. Will check liver function and r/o gastric ulcer. Continue to monitor. Consider ultrasound.  - Hepatic function panel - H. pylori breath test  2. Syncope, unspecified syncope type Obtain TSH and HgbA1c. Blood work and EKG at [facility]  were unremarkable. Has not had another episode since original event. Continue to monitor at this time. If repeat occurrence will obtain halter monitor and obtain cardiology consult.   - TSH - Hemoglobin A1c; Future - Hemoglobin A1c  3. Need for Tdap vaccination Unable to recall last tetanus vaccination, agreed to receive Tdap. - Tdap vaccine greater than or equal to 7yo IM

## 2014-02-22 LAB — H. PYLORI BREATH TEST: H. pylori Breath Test: NOT DETECTED

## 2015-01-20 ENCOUNTER — Ambulatory Visit (INDEPENDENT_AMBULATORY_CARE_PROVIDER_SITE_OTHER): Payer: Worker's Compensation | Admitting: Family Medicine

## 2015-01-20 ENCOUNTER — Ambulatory Visit: Payer: Worker's Compensation

## 2015-01-20 VITALS — BP 122/80 | HR 57 | Temp 97.6°F | Resp 16 | Ht 74.0 in | Wt 243.8 lb

## 2015-01-20 DIAGNOSIS — M545 Low back pain: Secondary | ICD-10-CM | POA: Diagnosis not present

## 2015-01-20 DIAGNOSIS — S39012A Strain of muscle, fascia and tendon of lower back, initial encounter: Secondary | ICD-10-CM

## 2015-01-20 MED ORDER — CYCLOBENZAPRINE HCL 5 MG PO TABS: ORAL_TABLET | ORAL | Status: AC

## 2015-01-20 NOTE — Progress Notes (Signed)
Subjective:    Patient ID: Howard Lopez, male    DOB: 1961/05/16, 54 y.o.   MRN: [id number] This chart was scribed for Merri Ray, MD by Zola Button, Medical Scribe. This patient was seen in Room 1 and the patient's care was started at 10:12 AM.    HPI HPI Comments: Howard Lopez is a 54 y.o. male who presents to the [facility] due an injury that occurred at work. Patient is complaining of sudden onset, middle low back pain that started last night around 11:30 PM while at work. 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 He did not feel a pulling sensation as he was grabbing the inmate's leg, but he felt the pain immediately after standing back up. None of his coworkers sustained any injuries. Patient did take Aleve last night which did provide some relief. He denies weakness in his legs, saddle paresthesias, and loss of bladder or bowel control. He also denies prior surgeries. Patient states he has had back pain in the past due to lifting injuries, which typically resolve after a few days. He notes that his current pain is slightly more severe than back injuries he has had in the past. He has had muscle relaxer in the past which has helped provide relief to his back pain.   No Known Allergies   Prior to Admission medications   Not on File      Review of Systems  Genitourinary: Negative for enuresis.  Musculoskeletal: Positive for back pain.  Neurological: Negative for weakness and numbness.       Objective:   Physical Exam  Constitutional: He is oriented to person, place, and time. He appears well-developed and well-nourished. No distress.  HENT:  Head: Normocephalic and atraumatic.  Mouth/Throat: Oropharynx is clear and moist. No oropharyngeal exudate.  Eyes: Pupils are equal, round, and reactive  to light.  Neck: Neck supple.  Cardiovascular: Normal rate.   Pulmonary/Chest: Effort normal.  Musculoskeletal: Normal range of motion. He exhibits no edema or tenderness.  Low back: full flexion at 90 degrees, but with discomfort. Discomfort with extension, but intact. Intact lateral flexion and rotation. On palpation, no midline bony tenderness. No apparent spasm or palpable tenderness. Locates the discomfort to the paraspinal mid LS spine, left greater than right.  Neurological: He is alert and oriented to person, place, and time. No cranial nerve deficit. He displays no Babinski's sign on the right side. He displays no Babinski's sign on the left side.  Reflex Scores:      Patellar reflexes are 2+ on the right side and 2+ on the left side.      Achilles reflexes are 2+ on the right side and 2+ on the left side. Skin: Skin is warm and dry. No rash noted.  Psychiatric: He has a normal mood and affect. His behavior is normal.  Vitals reviewed.     Filed Vitals:   01/20/15 0929  BP: 122/80  Pulse: 57  Temp: 97.6 F (36.4 C)  TempSrc: Oral  Resp: 16  Height: 6\' 2"  (1.88 m)  Weight: 243 lb 12.8 oz (110.587 kg)  SpO2: 97%    [facility] reading (PRIMARY) by  Dr. Carlota Raspberry: LS spine: questionable anterolisthesis of L5 on sacrum, no acute findings otherwise.      Assessment & Plan:  Howard Lopez is a 54 y.o. male Low back pain without sciatica, unspecified back pain laterality - Plan: DG Lumbar Spine 2-3 Views, cyclobenzaprine (FLEXERIL) 5 MG tablet  Low back strain, initial encounter - Plan: cyclobenzaprine (FLEXERIL) 5 MG tablet  Strain low back due to injury at work.  Reassuring exam.    -trial of otc alleve, sx care with heat or ice, back care manual, and flexeril if needed - SED.   -work restrictions with follow up in 4 days.   Meds ordered this encounter  Medications  . cyclobenzaprine (FLEXERIL) 5 MG tablet    Sig: 1 pill by mouth up to every 8 hours as needed. Start  with one pill by mouth each bedtime as needed due to sedation    Dispense:  15 tablet    Refill:  0   Patient Instructions  You likely have a sprained ligament or strained muscle in the low back, which can lead to some muscle spasm as well. Try the alleve over the counter as needed with food, flexeril at night if needed for muscle spasm.Marland Kitchen  Heat or ice to area as needed and the other treatments and exercises in the back care manual as tolerated. Restrictions at work until follow up Sunday.  Return to the clinic or go to the nearest emergency room if any of your symptoms worsen or new symptoms occur.  Lumbosacral Strain Lumbosacral strain is a strain of any of the parts that make up your lumbosacral vertebrae. Your lumbosacral vertebrae are the bones that make up the lower third of your backbone. Your lumbosacral vertebrae are held together by muscles and tough, fibrous tissue (ligaments).  CAUSES  A sudden blow to your back can cause lumbosacral strain. Also, anything that causes an excessive stretch of the muscles in the low back can cause this strain. This is typically seen when people exert themselves strenuously, fall, lift heavy objects, bend, or crouch repeatedly. RISK FACTORS  Physically demanding work.  Participation in pushing or pulling sports or sports that require a sudden twist of the back (tennis, golf, baseball).  Weight lifting.  Excessive lower back curvature.  Forward-tilted pelvis.  Weak back or abdominal muscles or both.  Tight hamstrings. SIGNS AND SYMPTOMS  Lumbosacral strain may cause pain in the area of your injury or pain that moves (radiates) down your leg.  DIAGNOSIS Your health care provider can often diagnose lumbosacral strain through a physical exam. In some cases, you may need tests such as X-ray exams.  TREATMENT  Treatment for your lower back injury depends on many factors that your clinician will have to evaluate. However, most treatment will include  the use of anti-inflammatory medicines. HOME CARE INSTRUCTIONS   Avoid hard physical activities (tennis, racquetball, waterskiing) if you are not in proper physical condition for it. This may aggravate or create problems.  If you have a back problem, avoid sports requiring sudden body movements. Swimming and walking are generally safer activities.  Maintain good posture.  Maintain a healthy weight.  For acute conditions, you may put ice on the injured area.  Put ice in a plastic bag.  Place a towel between your skin and the bag.  Leave the ice on for 20 minutes, 2-3 times a day.  When the low back starts healing, stretching and strengthening exercises may be recommended. SEEK MEDICAL CARE IF:  Your back pain is getting worse.  You experience severe back pain not relieved with medicines. SEEK IMMEDIATE MEDICAL CARE IF:  You have numbness, tingling, weakness, or problems with the use of your arms or legs.  There is a change in bowel or bladder control.  You have increasing pain in any area of the body, including your belly (abdomen).  You notice shortness of breath, dizziness, or feel faint.  You feel sick to your stomach (nauseous), are throwing up (vomiting), or become sweaty.  You notice discoloration of your toes or legs, or your feet get very cold. MAKE SURE YOU:   Understand these instructions.  Will watch your condition.  Will get help right away if you are not doing well or get worse. Document Released: 05/24/2005 Document Revised: 08/19/2013 Document Reviewed: 04/02/2013 [facility] Patient Information 2015 [place], [place]. This information is not intended to replace advice given to you by your health care provider. Make sure you discuss any questions you have with your health care provider.     I personally performed the services described in this documentation, which was scribed in my presence. The recorded information has been reviewed and considered, and  addended by me as needed.

## 2015-01-20 NOTE — Patient Instructions (Addendum)
You likely have a sprained ligament or strained muscle in the low back, which can lead to some muscle spasm as well. Try the alleve over the counter as needed with food, flexeril at night if needed for muscle spasm.Marland Kitchen  Heat or ice to area as needed and the other treatments and exercises in the back care manual as tolerated. Restrictions at work until follow up Sunday.  Return to the clinic or go to the nearest emergency room if any of your symptoms worsen or new symptoms occur.  Lumbosacral Strain Lumbosacral strain is a strain of any of the parts that make up your lumbosacral vertebrae. Your lumbosacral vertebrae are the bones that make up the lower third of your backbone. Your lumbosacral vertebrae are held together by muscles and tough, fibrous tissue (ligaments).  CAUSES  A sudden blow to your back can cause lumbosacral strain. Also, anything that causes an excessive stretch of the muscles in the low back can cause this strain. This is typically seen when people exert themselves strenuously, fall, lift heavy objects, bend, or crouch repeatedly. RISK FACTORS  Physically demanding work.  Participation in pushing or pulling sports or sports that require a sudden twist of the back (tennis, golf, baseball).  Weight lifting.  Excessive lower back curvature.  Forward-tilted pelvis.  Weak back or abdominal muscles or both.  Tight hamstrings. SIGNS AND SYMPTOMS  Lumbosacral strain may cause pain in the area of your injury or pain that moves (radiates) down your leg.  DIAGNOSIS Your health care provider can often diagnose lumbosacral strain through a physical exam. In some cases, you may need tests such as X-ray exams.  TREATMENT  Treatment for your lower back injury depends on many factors that your clinician will have to evaluate. However, most treatment will include the use of anti-inflammatory medicines. HOME CARE INSTRUCTIONS   Avoid hard physical activities (tennis, racquetball,  waterskiing) if you are not in proper physical condition for it. This may aggravate or create problems.  If you have a back problem, avoid sports requiring sudden body movements. Swimming and walking are generally safer activities.  Maintain good posture.  Maintain a healthy weight.  For acute conditions, you may put ice on the injured area.  Put ice in a plastic bag.  Place a towel between your skin and the bag.  Leave the ice on for 20 minutes, 2-3 times a day.  When the low back starts healing, stretching and strengthening exercises may be recommended. SEEK MEDICAL CARE IF:  Your back pain is getting worse.  You experience severe back pain not relieved with medicines. SEEK IMMEDIATE MEDICAL CARE IF:   You have numbness, tingling, weakness, or problems with the use of your arms or legs.  There is a change in bowel or bladder control.  You have increasing pain in any area of the body, including your belly (abdomen).  You notice shortness of breath, dizziness, or feel faint.  You feel sick to your stomach (nauseous), are throwing up (vomiting), or become sweaty.  You notice discoloration of your toes or legs, or your feet get very cold. MAKE SURE YOU:   Understand these instructions.  Will watch your condition.  Will get help right away if you are not doing well or get worse. Document Released: 05/24/2005 Document Revised: 08/19/2013 Document Reviewed: 04/02/2013 [facility] Patient Information 2015 [place], [place]. This information is not intended to replace advice given to you by your health care provider. Make sure you discuss any questions you have  with your health care provider.  

## 2015-01-24 ENCOUNTER — Telehealth: Payer: Self-pay | Admitting: *Deleted

## 2015-01-24 ENCOUNTER — Ambulatory Visit (INDEPENDENT_AMBULATORY_CARE_PROVIDER_SITE_OTHER): Payer: Worker's Compensation | Admitting: Family Medicine

## 2015-01-24 VITALS — BP 122/74 | HR 55 | Temp 97.6°F | Resp 18 | Ht 74.0 in | Wt 244.0 lb

## 2015-01-24 DIAGNOSIS — S39012D Strain of muscle, fascia and tendon of lower back, subsequent encounter: Secondary | ICD-10-CM
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
## 2015-01-24 NOTE — Patient Instructions (Signed)
Continue alleve if needed, flexeril if needed, exercises in back care manual. Recheck with Dr. Carlota Raspberry in 8 days (Tuesday June 8th, 8:30-2:30 with Dr. Carlota Raspberry) If any difficulty tolerating full duty - return sooner.  Return to the clinic or go to the nearest emergency room if any of your symptoms worsen or new symptoms occur.

## 2015-01-24 NOTE — Telephone Encounter (Signed)
Called pt and left message on voicemail stating he left his Return to Work Note here in the office.  Advised pt of our closing times of 4 pm today.

## 2015-03-09 ENCOUNTER — Encounter (HOSPITAL_COMMUNITY): Payer: Self-pay | Admitting: Emergency Medicine

## 2015-03-09 ENCOUNTER — Emergency Department (HOSPITAL_COMMUNITY)
Admission: EM | Admit: 2015-03-09 | Discharge: 2015-03-10 | Disposition: A | Discharge status: Home / Self Care | Payer: Worker's Compensation | Attending: Emergency Medicine | Admitting: Emergency Medicine

## 2015-03-09 ENCOUNTER — Emergency Department (HOSPITAL_COMMUNITY): Payer: Worker's Compensation

## 2015-03-09 DIAGNOSIS — Z973 Presence of spectacles and contact lenses: Secondary | ICD-10-CM | POA: Diagnosis not present

## 2015-03-09 DIAGNOSIS — Y9389 Activity, other specified: Secondary | ICD-10-CM | POA: Diagnosis not present

## 2015-03-09 DIAGNOSIS — Y929 Unspecified place or not applicable: Secondary | ICD-10-CM | POA: Insufficient documentation

## 2015-03-09 DIAGNOSIS — Y998 Other external cause status: Secondary | ICD-10-CM | POA: Insufficient documentation

## 2015-03-09 DIAGNOSIS — M79642 Pain in left hand: Secondary | ICD-10-CM

## 2015-03-09 DIAGNOSIS — S6992XA Unspecified injury of left wrist, hand and finger(s), initial encounter: Secondary | ICD-10-CM | POA: Diagnosis not present

## 2015-03-09 DIAGNOSIS — I10 Essential (primary) hypertension: Secondary | ICD-10-CM | POA: Diagnosis not present

## 2015-03-09 DIAGNOSIS — W51XXXA Accidental striking against or bumped into by another person, initial encounter: Secondary | ICD-10-CM | POA: Diagnosis not present

## 2015-03-09 MED ORDER — IBUPROFEN 800 MG PO TABS: 800.0000 mg | ORAL_TABLET | Freq: Three times a day (TID) | ORAL | Status: AC

## 2015-03-09 NOTE — ED Notes (Signed)
Pt injured left hand, knuckles of pointer and middle fingers red, swollen, and painful. No obvious deformities, able to move on command
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
## 2015-03-09 NOTE — Discharge Instructions (Signed)
Cryotherapy °Cryotherapy means treatment with cold. Ice or gel packs can be used to reduce both pain and swelling. Ice is the most helpful within the first 24 to 48 hours after an injury or flare-up from overusing a muscle or joint. Sprains, strains, spasms, burning pain, shooting pain, and aches can all be eased with ice. Ice can also be used when recovering from surgery. Ice is effective, has very few side effects, and is safe for most people to use. °PRECAUTIONS  °Ice is not a safe treatment option for people with: °· Raynaud phenomenon. This is a condition affecting small blood vessels in the extremities. Exposure to cold may cause your problems to return. °· Cold hypersensitivity. There are many forms of cold hypersensitivity, including: °¨ Cold urticaria. Red, itchy hives appear on the skin when the tissues begin to warm after being iced. °¨ Cold erythema. This is a red, itchy rash caused by exposure to cold. °¨ Cold hemoglobinuria. Red blood cells break down when the tissues begin to warm after being iced. The hemoglobin that carry oxygen are passed into the urine because they cannot combine with blood proteins fast enough. °· Numbness or altered sensitivity in the area being iced. °If you have any of the following conditions, do not use ice until you have discussed cryotherapy with your caregiver: °· Heart conditions, such as arrhythmia, angina, or chronic heart disease. °· High blood pressure. °· Healing wounds or open skin in the area being iced. °· Current infections. °· Rheumatoid arthritis. °· Poor circulation. °· Diabetes. °Ice slows the blood flow in the region it is applied. This is beneficial when trying to stop inflamed tissues from spreading irritating chemicals to surrounding tissues. However, if you expose your skin to cold temperatures for too long or without the proper protection, you can damage your skin or nerves. Watch for signs of skin damage due to cold. °HOME CARE INSTRUCTIONS °Follow  these tips to use ice and cold packs safely. °· Place a dry or damp towel between the ice and skin. A damp towel will cool the skin more quickly, so you may need to shorten the time that the ice is used. °· For a more rapid response, add gentle compression to the ice. °· Ice for no more than 10 to 20 minutes at a time. The bonier the area you are icing, the less time it will take to get the benefits of ice. °· Check your skin after 5 minutes to make sure there are no signs of a poor response to cold or skin damage. °· Rest 20 minutes or more between uses. °· Once your skin is numb, you can end your treatment. You can test numbness by very lightly touching your skin. The touch should be so light that you do not see the skin dimple from the pressure of your fingertip. When using ice, most people will feel these normal sensations in this order: cold, burning, aching, and numbness. °· Do not use ice on someone who cannot communicate their responses to pain, such as small children or people with dementia. °HOW TO MAKE AN ICE PACK °Ice packs are the most common way to use ice therapy. Other methods include ice massage, ice baths, and cryosprays. Muscle creams that cause a cold, tingly feeling do not offer the same benefits that ice offers and should not be used as a substitute unless recommended by your caregiver. °To make an ice pack, do one of the following: °· Place crushed ice or a   bag of frozen vegetables in a sealable plastic bag. Squeeze out the excess air. Place this bag inside another plastic bag. Slide the bag into a pillowcase or place a damp towel between your skin and the bag. °· Mix 3 parts water with 1 part rubbing alcohol. Freeze the mixture in a sealable plastic bag. When you remove the mixture from the freezer, it will be slushy. Squeeze out the excess air. Place this bag inside another plastic bag. Slide the bag into a pillowcase or place a damp towel between your skin and the bag. °SEEK MEDICAL CARE  IF: °· You develop white spots on your skin. This may give the skin a blotchy (mottled) appearance. °· Your skin turns blue or pale. °· Your skin becomes waxy or hard. °· Your swelling gets worse. °MAKE SURE YOU:  °· Understand these instructions. °· Will watch your condition. °· Will get help right away if you are not doing well or get worse. °Document Released: 04/10/2011 Document Revised: 12/29/2013 Document Reviewed: 04/10/2011 °ExitCare® Patient Information ©2015 [employer]. This information is not intended to replace advice given to you by your health care provider. Make sure you discuss any questions you have with your health care provider. ° °
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
## 2021-02-01 ENCOUNTER — Encounter: Payer: Self-pay | Admitting: *Deleted

## 2021-03-28 ENCOUNTER — Telehealth (INDEPENDENT_AMBULATORY_CARE_PROVIDER_SITE_OTHER): Payer: Self-pay | Admitting: Gastroenterology

## 2021-03-28 DIAGNOSIS — Z1211 Encounter for screening for malignant neoplasm of colon: Secondary | ICD-10-CM

## 2021-03-28 DIAGNOSIS — Z800 Family history of malignant neoplasm of digestive organs: Secondary | ICD-10-CM

## 2021-03-28 MED ORDER — PEG 3350-KCL-NA BICARB-NACL 420 G PO SOLR: 4000.0000 mL | Freq: Once | ORAL | 0 refills | Status: AC

## 2021-03-28 NOTE — Progress Notes (Signed)
Gastroenterology Pre-Procedure Review  Request Date: 04/26/21 Requesting Physician: Dr. Allen Norris  PATIENT REVIEW QUESTIONS: The patient responded to the following health history questions as indicated:    1. Are you having any GI issues? no 2. Do you have a personal history of Polyps? no 3. Do you have a family history of Colon Cancer or Polyps? yes (Maternal Grandfather colon cancer) 4. Diabetes Mellitus? no 5. Joint replacements in the past 12 months?no 6. Major health problems in the past 3 months?no 7. Any artificial heart valves, MVP, or defibrillator?no    MEDICATIONS & ALLERGIES:    Patient reports the following regarding taking any anticoagulation/antiplatelet therapy:   Plavix, Coumadin, Eliquis, Xarelto, Lovenox, Pradaxa, Brilinta, or Effient? no Aspirin? no  Patient confirms/reports the following medications:  Current Outpatient Medications  Medication Sig Dispense Refill   cyclobenzaprine (FLEXERIL) 5 MG tablet 1 pill by mouth up to every 8 hours as needed. Start with one pill by mouth each bedtime as needed due to sedation (Patient not taking: Reported on 01/24/2015) 15 tablet 0   ibuprofen (ADVIL,MOTRIN) 800 MG tablet Take 1 tablet (800 mg total) by mouth 3 (three) times daily. 21 tablet 0   No current facility-administered medications for this visit.    Patient confirms/reports the following allergies:  No Known Allergies  No orders of the defined types were placed in this encounter.   AUTHORIZATION INFORMATION Primary Insurance: 1D#: Group #:  Secondary Insurance: 1D#: Group #:  SCHEDULE INFORMATION: Date: 04/26/21 Time: Location: [facility]

## 2021-04-13 ENCOUNTER — Telehealth: Payer: Self-pay

## 2021-04-13 NOTE — Telephone Encounter (Signed)
Patient has requested to change his procedure date due to his work schedule. New procedure date is 10/11, endo unit has been notified and updated instructions will be sent.

## 2021-06-06 ENCOUNTER — Encounter: Payer: Self-pay | Admitting: Gastroenterology

## 2021-06-07 ENCOUNTER — Ambulatory Visit: Payer: 59 | Admitting: Anesthesiology

## 2021-06-07 ENCOUNTER — Encounter
Admission: RE | Disposition: A | Discharge status: Home / Self Care | Payer: Self-pay | Source: Home / Self Care | Attending: Gastroenterology

## 2021-06-07 ENCOUNTER — Other Ambulatory Visit: Payer: Self-pay

## 2021-06-07 ENCOUNTER — Ambulatory Visit
Admission: RE | Admit: 2021-06-07 | Discharge: 2021-06-07 | Disposition: A | Discharge status: Home / Self Care | Payer: 59 | Attending: Gastroenterology | Admitting: Gastroenterology

## 2021-06-07 ENCOUNTER — Encounter: Payer: Self-pay | Admitting: Gastroenterology

## 2021-06-07 DIAGNOSIS — Z800 Family history of malignant neoplasm of digestive organs: Secondary | ICD-10-CM | POA: Insufficient documentation

## 2021-06-07 DIAGNOSIS — Z1211 Encounter for screening for malignant neoplasm of colon: Secondary | ICD-10-CM | POA: Insufficient documentation

## 2021-06-07 DIAGNOSIS — K635 Polyp of colon: Secondary | ICD-10-CM | POA: Diagnosis not present

## 2021-06-07 DIAGNOSIS — K5730 Diverticulosis of large intestine without perforation or abscess without bleeding: Secondary | ICD-10-CM | POA: Insufficient documentation

## 2021-06-07 DIAGNOSIS — K640 First degree hemorrhoids: Secondary | ICD-10-CM | POA: Diagnosis not present

## 2021-06-07 DIAGNOSIS — D122 Benign neoplasm of ascending colon: Secondary | ICD-10-CM | POA: Diagnosis not present

## 2021-06-07 HISTORY — PX: COLONOSCOPY WITH PROPOFOL: SHX5780

## 2021-06-07 SURGERY — COLONOSCOPY WITH PROPOFOL
Anesthesia: General

## 2021-06-07 MED ORDER — SODIUM CHLORIDE 0.9 % IV SOLN: INTRAVENOUS | Status: DC

## 2021-06-07 MED ORDER — PROPOFOL 500 MG/50ML IV EMUL
INTRAVENOUS | Status: DC | PRN
  Administered 2021-06-07: 200 ug/kg/min via INTRAVENOUS

## 2021-06-07 MED ORDER — PROPOFOL 10 MG/ML IV BOLUS
INTRAVENOUS | Status: DC | PRN
  Administered 2021-06-07: 80 mg via INTRAVENOUS

## 2021-06-07 NOTE — Anesthesia Preprocedure Evaluation (Signed)
Anesthesia Evaluation  Patient identified by MRN, date of birth, ID band Patient awake    Reviewed: Allergy & Precautions, NPO status , Patient's Chart, lab work & pertinent test results  Airway Mallampati: II  TM Distance: >3 FB Neck ROM: Full    Dental no notable dental hx.    Pulmonary neg pulmonary ROS,    Pulmonary exam normal        Cardiovascular hypertension (Pt Denies), Normal cardiovascular exam     Neuro/Psych negative neurological ROS  negative psych ROS   GI/Hepatic negative GI ROS, Neg liver ROS, Bowel prep,  Endo/Other  negative endocrine ROS  Renal/GU negative Renal ROS  negative genitourinary   Musculoskeletal negative musculoskeletal ROS (+)   Abdominal   Peds negative pediatric ROS (+)  Hematology negative hematology ROS (+)   Anesthesia Other Findings Vasovagal Syncope years ago  Reproductive/Obstetrics negative OB ROS                             Anesthesia Physical Anesthesia Plan  ASA: 1  Anesthesia Plan: General   Post-op Pain Management:    Induction: Intravenous  PONV Risk Score and Plan: 2 and TIVA and Midazolam  Airway Management Planned: Natural Airway and Nasal Cannula  Additional Equipment:   Intra-op Plan:   Post-operative Plan:   Informed Consent: I have reviewed the patients History and Physical, chart, labs and discussed the procedure including the risks, benefits and alternatives for the proposed anesthesia with the patient or authorized representative who has indicated his/her understanding and acceptance.       Plan Discussed with: CRNA, Anesthesiologist and Surgeon  Anesthesia Plan Comments:         Anesthesia Quick Evaluation

## 2021-06-07 NOTE — Op Note (Signed)
[facility] Patient Name: Howard Lopez Procedure Date: 06/07/2021 10:01 AM MRN: [id number] Account #: [id number] Date of Birth: [birth year] Admit Type: Outpatient Age: 60 Room: [facility] ENDO ROOM 4 Gender: Male Note Status: Finalized Instrument Name: Jasper Riling [id number] Procedure:             Colonoscopy Indications:           Screening for colorectal malignant neoplasm Providers:             Lucilla Lame MD, MD Medicines:             Propofol per Anesthesia Complications:         No immediate complications. Procedure:             Pre-Anesthesia Assessment:                        - Prior to the procedure, a History and Physical was                         performed, and patient medications and allergies were                         reviewed. The patient's tolerance of previous                         anesthesia was also reviewed. The risks and benefits                         of the procedure and the sedation options and risks                         were discussed with the patient. All questions were                         answered, and informed consent was obtained. Prior                         Anticoagulants: The patient has taken no previous                         anticoagulant or antiplatelet agents. ASA Grade                         Assessment: II - A patient with mild systemic disease.                         After reviewing the risks and benefits, the patient                         was deemed in satisfactory condition to undergo the                         procedure.                        After obtaining informed consent, the colonoscope was                         passed under direct vision. Throughout the procedure,  the patient's blood pressure, pulse, and oxygen                         saturations were monitored continuously. The                         Colonoscope was introduced through the anus and                          advanced to the the cecum, identified by appendiceal                         orifice and ileocecal valve. The colonoscopy was                         performed without difficulty. The patient tolerated                         the procedure well. The quality of the bowel                         preparation was good. Findings:      The perianal and digital rectal examinations were normal.      Two sessile polyps were found in the ascending colon. The polyps were 4       to 5 mm in size. These polyps were removed with a cold snare. Resection       and retrieval were complete.      A 1 mm polyp was found in the transverse colon. The polyp was sessile.       The polyp was removed with a cold snare. Resection was complete, but the       polyp tissue was not retrieved.      A few small-mouthed diverticula were found in the sigmoid colon.      Non-bleeding internal hemorrhoids were found during retroflexion. The       hemorrhoids were Grade I (internal hemorrhoids that do not prolapse). Impression:            - Two 4 to 5 mm polyps in the ascending colon, removed                         with a cold snare. Resected and retrieved.                        - One 1 mm polyp in the transverse colon, removed with                         a cold snare. Complete resection. Polyp tissue not                         retrieved.                        - Diverticulosis in the sigmoid colon.                        - Non-bleeding internal hemorrhoids. Recommendation:        - Discharge patient to home.                        -  Resume previous diet.                        - Continue present medications.                        - Await pathology results.                        - If the pathology report reveals adenomatous tissue,                         then repeat the colonoscopy for surveillance in 7                         years otherwise 10 years. Procedure Code(s):     --- Professional ---                         [id number], Colonoscopy, flexible; with removal of                         tumor(s), polyp(s), or other lesion(s) by snare                         technique Diagnosis Code(s):     --- Professional ---                        Z12.11, Encounter for screening for malignant neoplasm                         of colon                        K63.5, Polyp of colon CPT copyright 2019 American Medical Association. All rights reserved. The codes documented in this report are preliminary and upon coder review may  be revised to meet current compliance requirements. Lucilla Lame MD, MD 06/07/2021 10:35:10 AM This report has been signed electronically. Number of Addenda: 0 Note Initiated On: 06/07/2021 10:01 AM Scope Withdrawal Time: 0 hours 13 minutes 4 seconds  Total Procedure Duration: 0 hours 17 minutes 27 seconds  Estimated Blood Loss:  Estimated blood loss: none.      [facility]

## 2021-06-07 NOTE — Transfer of Care (Signed)
Immediate Anesthesia Transfer of Care Note  Patient: JUERGEN HARDENBROOK  Procedure(s) Performed: COLONOSCOPY WITH PROPOFOL  Patient Location: PACU  Anesthesia Type:General  Level of Consciousness: sedated  Airway & Oxygen Therapy: Patient Spontanous Breathing and Patient connected to nasal cannula oxygen  Post-op Assessment: Report given to RN and Post -op Vital signs reviewed and stable  Post vital signs: Reviewed and stable  Last Vitals:  Vitals Value Taken Time  BP 115/79 06/07/21 1036  Temp 36.6 C 06/07/21 1036  Pulse 63 06/07/21 1040  Resp 14 06/07/21 1040  SpO2 100 % 06/07/21 1040  Vitals shown include unvalidated device data.  Last Pain:  Vitals:   06/07/21 1036  TempSrc: Temporal  PainSc: Asleep         Complications: No notable events documented.

## 2021-06-07 NOTE — Anesthesia Postprocedure Evaluation (Signed)
Anesthesia Post Note  Patient: Howard Lopez  Procedure(s) Performed: COLONOSCOPY WITH PROPOFOL  Patient location during evaluation: Phase II Anesthesia Type: General Level of consciousness: awake and alert, awake and oriented Pain management: pain level controlled Vital Signs Assessment: post-procedure vital signs reviewed and stable Respiratory status: spontaneous breathing, nonlabored ventilation and respiratory function stable Cardiovascular status: blood pressure returned to baseline and stable Postop Assessment: no apparent nausea or vomiting Anesthetic complications: no   No notable events documented.   Last Vitals:  Vitals:   06/07/21 1056 06/07/21 1106  BP: (!) 132/99 (!) 126/93  Pulse: 73 (!) 59  Resp: 17 15  Temp:    SpO2: 95% 100%    Last Pain:  Vitals:   06/07/21 1106  TempSrc:   PainSc: 0-No pain                 Phill Mutter

## 2021-06-07 NOTE — H&P (Signed)
Lucilla Lame, MD [address] [place], [place] [address] Phone: [phone] Fax : [phone]  Primary Care Physician:  Patient, No Pcp Per (Inactive) Primary Gastroenterologist:  Dr. Allen Norris  Pre-Procedure History & Physical: HPI:  Howard Lopez is a 60 y.o. male is here for a screening colonoscopy.   Past Medical History:  Diagnosis Date   Hypertension 01/11/14   @ [facility]   Wears glasses    reading    Past Surgical History:  Procedure Laterality Date   FRACTURE SURGERY     HERNIA REPAIR     When pt was 60 years old   ORIF ANKLE FRACTURE Left 01/22/2014   Procedure: OPEN REDUCTION INTERNAL FIXATION (ORIF) LEFT ANKLE FRACTURE/SYNDESMOSIS DISRUPTION;  Surgeon: Wylene Simmer, MD;  Location: [facility];  Service: Orthopedics;  Laterality: Left; - Dr. Doran Durand   WISDOM TOOTH EXTRACTION  08/28/1992    Prior to Admission medications   Medication Sig Start Date End Date Taking? Authorizing Provider  cyclobenzaprine (FLEXERIL) 5 MG tablet 1 pill by mouth up to every 8 hours as needed. Start with one pill by mouth each bedtime as needed due to sedation Patient not taking: No sig reported 01/20/15   Wendie Agreste, MD  ibuprofen (ADVIL,MOTRIN) 800 MG tablet Take 1 tablet (800 mg total) by mouth 3 (three) times daily. 03/09/15   Charlann Lange, PA-C    Allergies as of 03/28/2021   (No Known Allergies)    Family History  Problem Relation Age of Onset   Cancer Maternal Grandfather        colon cancer   Heart failure Maternal Grandmother    Heart attack Paternal Grandmother    COPD Paternal Grandfather     Social History   Socioeconomic History   Marital status: Married    Spouse name: Mardene Celeste   Number of children: 3   Years of education: 16   Highest education level: Not on file  Occupational History   Occupation: Theatre manager: [employer]  Tobacco Use   Smoking status: Never   Smokeless tobacco: Never   Vaping Use   Vaping Use: Never used  Substance and Sexual Activity   Alcohol use: No   Drug use: No   Sexual activity: Yes  Other Topics Concern   Not on file  Social History Narrative   Born and raised in [place], [place]. Attended [employer] [place] for communications. Currently married to wife Mardene Celeste, and has children Hilliard Clark (age 84), Arby Barrette (age 92), Hope (age 22). 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    Social Determinants of Health   Financial Resource Strain: Not on file  Food Insecurity: Not on file  Transportation Needs: Not on file  Physical Activity: Not on file  Stress: Not on file  Social Connections: Not on file  Intimate Partner Violence: Not on file    Review of Systems: See HPI, otherwise negative ROS  Physical Exam: BP (!) 134/91   Pulse (!) 55   Temp (!) 96.3 F (35.7 C) (Temporal)   Resp 16   Ht 6\' 2"  (1.88 m)   Wt 106.6 kg   SpO2 99%   BMI 30.17 kg/m  General:   Alert,  pleasant and cooperative in NAD Head:  Normocephalic and atraumatic. Neck:  Supple; no masses or thyromegaly. Lungs:  Clear throughout to auscultation.    Heart:  Regular rate and rhythm. Abdomen:  Soft,  nontender and nondistended. Normal bowel sounds, without guarding, and without rebound.   Neurologic:  Alert and  oriented x4;  grossly normal neurologically.  Impression/Plan: Howard Lopez is now here to undergo a screening colonoscopy.  Risks, benefits, and alternatives regarding colonoscopy have been reviewed with the patient.  Questions have been answered.  All parties agreeable.

## 2021-06-08 LAB — SURGICAL PATHOLOGY

## 2021-06-09 ENCOUNTER — Encounter: Payer: Self-pay | Admitting: Gastroenterology

## 2021-06-13 ENCOUNTER — Encounter: Payer: Self-pay | Admitting: Gastroenterology

## 2024-07-22 ENCOUNTER — Ambulatory Visit

## 2024-07-22 DIAGNOSIS — L7211 Pilar cyst: Secondary | ICD-10-CM | POA: Diagnosis not present

## 2024-07-22 NOTE — Patient Instructions (Signed)
 Pre-Operative Instructions You are scheduled for a surgical procedure at [facility]. We recommend you read the following instructions. If you have any questions or concerns, please call the office at [phone].  Shower and wash the entire body with soap and water the day of your surgery paying special attention to cleansing at and around the planned surgery site.  Please continue to take your anticoagulants (blood thinners) as you normally     would before and after surgery if they were prescribed by a medical provider. Stopping them could be harmful to you. We have multiple tools in dermatology to stop the bleeding even if you take an anticoagulant. If you take over the counter blood thinner such as aspirin, Ibuprofen (Motrin, Advil and Nuprin), Naprosyn , Voltaren , Relafen, etc. that was not prescribed or recommended by a medical provider, we recommend that you stop taking it for a week before your surgery and wait to restart until 2 days after your surgery.  Please inform us  of all medications you are currently taking. All medications that are taken regularly should be taken the day of surgery as you always do. Nevertheless, we need to be informed of what medications you are taking prior to surgery to know whether they will affect the procedure or cause any complications.   Please inform us  of any medication allergies. Also inform us  of whether you have allergies to Latex or rubber products or whether you have had any adverse reaction to Lidocaine  or Epinephrine .  Please inform us  of any prosthetic or artificial body parts such as artificial heart valve, joint replacements, etc., or similar condition that might require preoperative antibiotics.   We recommend avoidance of alcohol at least two weeks prior to surgery and continued avoidance for at least two weeks after surgery.   We recommend discontinuation of tobacco smoking at least two weeks prior to surgery and continued  abstinence for at least two weeks after surgery.  Do not plan strenuous exercise, strenuous work or strenuous lifting for approximately four weeks after your surgery.   We request if you are unable to make your scheduled surgical appointment, please call us  at least a week in advance or as soon as you are aware of a problem so that we can cancel or reschedule the appointment.   You MAY TAKE TYLENOL  (acetaminophen ) for pain as it is not a blood thinner.   PLEASE PLAN TO BE IN TOWN FOR TWO WEEKS FOLLOWING SURGERY, THIS IS IMPORTANT SO YOU CAN BE CHECKED FOR DRESSING CHANGES, FUTURE REMOVAL AND TO MONITOR FOR POSSIBLE COMPLICATIONS.   Due to recent changes in healthcare laws, you may see results of your pathology and/or laboratory studies on MyChart before the doctors have had a chance to review them. We understand that in some cases there may be results that are confusing or concerning to you. Please understand that not all results are received at the same time and often the doctors may need to interpret multiple results in order to provide you with the best plan of care or course of treatment. Therefore, we ask that you please give us  2 business days to thoroughly review all your results before contacting the office for clarification. Should we see a critical lab result, you will be contacted sooner.   If You Need Anything After Your Visit  If you have any questions or concerns for your doctor, please call our main line at [phone] and press option 4 to reach your doctor's medical assistant. If  no one answers, please leave a voicemail as directed and we will return your call as soon as possible. Messages left after 4 pm will be answered the following business day.   You may also send us  a message via MyChart. We typically respond to MyChart messages within 1-2 business days.  For prescription refills, please ask your pharmacy to contact our office. Our fax number is [phone].  If you have  an urgent issue when the clinic is closed that cannot wait until the next business day, you can page your doctor at the number below.    Please note that while we do our best to be available for urgent issues outside of office hours, we are not available 24/7.   If you have an urgent issue and are unable to reach us , you may choose to seek medical care at your doctor's office, retail clinic, urgent care center, or emergency room.  If you have a medical emergency, please immediately call 911 or go to the emergency department.  Pager Numbers  - Dr. Hester: [phone]  - Dr. Jackquline: [phone]  - Dr. Claudene: [phone]   - Dr. Raymund: [phone]  In the event of inclement weather, please call our main line at [phone] for an update on the status of any delays or closures.  Dermatology Medication Tips: Please keep the boxes that topical medications come in in order to help keep track of the instructions about where and how to use these. Pharmacies typically print the medication instructions only on the boxes and not directly on the medication tubes.   If your medication is too expensive, please contact our office at [phone] option 4 or send us  a message through MyChart.   We are unable to tell what your co-pay for medications will be in advance as this is different depending on your insurance coverage. However, we may be able to find a substitute medication at lower cost or fill out paperwork to get insurance to cover a needed medication.   If a prior authorization is required to get your medication covered by your insurance company, please allow us  1-2 business days to complete this process.  Drug prices often vary depending on where the prescription is filled and some pharmacies may offer cheaper prices.  The website www.goodrx.com contains coupons for medications through different pharmacies. The prices here do not account for what the cost may be with help from  insurance (it may be cheaper with your insurance), but the website can give you the price if you did not use any insurance.  - You can print the associated coupon and take it with your prescription to the pharmacy.  - You may also stop by our office during regular business hours and pick up a GoodRx coupon card.  - If you need your prescription sent electronically to a different pharmacy, notify our office through [facility] or by phone at [phone] option 4.     Si Usted Necesita Algo Despus de Su Visita  Tambin puede enviarnos un mensaje a travs de Clinical cytogeneticist. Por lo general respondemos a los mensajes de MyChart en el transcurso de 1 a 2 das hbiles.  Para renovar recetas, por favor pida a su farmacia que se ponga en contacto con nuestra oficina. Randi lakes de fax es [address] [phone].  Si tiene un asunto urgente cuando la clnica est cerrada y que no puede esperar hasta el siguiente da hbil, puede llamar/localizar a su doctor(a) al nmero que aparece a continuacin.  Por favor, tenga en cuenta que aunque hacemos todo lo posible para estar disponibles para asuntos urgentes fuera del horario de [place], no estamos disponibles las 24 horas del da, los 7 [address]  [address] de la [place].   Si tiene un problema urgente y no puede comunicarse con nosotros, puede optar por buscar atencin mdica  en el consultorio de su doctor(a), en una clnica privada, en un centro de atencin urgente o en una sala de emergencias.  Si tiene Engineer, drilling, por favor llame inmediatamente al 911 o vaya a la sala de emergencias.  Nmeros de bper  - Dr. Hester: [phone]  - Dra. Jackquline: [phone]  - Dr. Claudene: [phone]  - Dra. Kitts: [phone]  En caso de inclemencias del [place], por favor llame a nuestra lnea principal al [phone] para una actualizacin sobre el estado de cualquier retraso o cierre.  Consejos para la medicacin en dermatologa: Por favor, guarde las  cajas en las que vienen los medicamentos de uso tpico para ayudarle a seguir las instrucciones sobre dnde y cmo usarlos. Las farmacias generalmente imprimen las instrucciones del medicamento slo en las cajas y no directamente en los tubos del [place].   Si su medicamento es muy caro, por favor, pngase en contacto con landry rieger llamando al [phone] y presione la opcin 4 o envenos un mensaje a travs de Clinical cytogeneticist.   No podemos decirle cul ser su copago por los medicamentos por adelantado ya que esto es diferente dependiendo de la cobertura de su seguro. Sin embargo, es posible que podamos encontrar un medicamento sustituto a Audiological scientist un formulario para que el seguro cubra el medicamento que se considera necesario.   Si se requiere una autorizacin previa para que su compaa de seguros malta su medicamento, por favor permtanos de 1 a 2 das hbiles para completar este proceso.  Los precios de los medicamentos varan con frecuencia dependiendo del Environmental consultant de dnde se surte la receta y alguna farmacias pueden ofrecer precios ms baratos.  El sitio web www.goodrx.com tiene cupones para medicamentos de Health and safety inspector. Los precios aqu no tienen en cuenta lo que podra costar con la ayuda del seguro (puede ser ms barato con su seguro), pero el sitio web puede darle el precio si no utiliz Tourist information centre manager.  - Puede imprimir el cupn correspondiente y llevarlo con su receta a la farmacia.  - Tambin puede pasar por nuestra oficina durante el horario de atencin regular y Education officer, museum una tarjeta de cupones de GoodRx.  - Si necesita que su receta se enve electrnicamente a una farmacia diferente, informe a nuestra oficina a travs de MyChart de [place] o por telfono llamando al [phone] y presione la opcin 4.

## 2024-07-22 NOTE — Progress Notes (Signed)
    Subjective   Howard Lopez is a 63 y.o. male who presents for the following: cyst of the scalp. Patient is new patient  Today patient reports: Cyst of the scalp x 6-8 mths. Hx of cysts being removed on the scalp over the past 30 years.  Review of Systems:    No other skin or systemic complaints except as noted in HPI or Assessment and Plan.  The following portions of the chart were reviewed this encounter and updated as appropriate: medications, allergies, medical history  Relevant Medical History:  n/a   Objective  (SKPE) Well appearing patient in no apparent distress; mood and affect are within normal limits. Examination was performed of the: Focused Exam of: the face and scalp   Examination notable for: firm SQ nodule 1.5 cm R parietal scalp Examination limited by: n/a     Assessment & Plan  (SKAP)   Subcutaneous cyst, favor pilar cyst - Explained to patient this most likely is consistent with a pilar cyst - Benign, patient reassured - Given that the lesion is symptomatic, patient would like to proceed with excision. They will be scheduled for surgical removal. Procedure discussed in detail.   Level of service outlined above   Patient instructions (SKPI)   Procedures, orders, diagnosis for this visit:    There are no diagnoses linked to this encounter.  Return to clinic: Return for cyst excision surgery.  Howard Lopez, CMA, am acting as scribe for Howard Howard Kanaris, MD .   Documentation: I have reviewed the above documentation for accuracy and completeness, and I agree with the above.  Howard Howard Kanaris, MD

## 2024-08-28 ENCOUNTER — Ambulatory Visit: Payer: Self-pay

## 2024-09-09 ENCOUNTER — Encounter

## 2024-09-23 ENCOUNTER — Inpatient Hospital Stay: Attending: Oncology | Admitting: Oncology

## 2024-09-23 ENCOUNTER — Inpatient Hospital Stay

## 2024-09-23 ENCOUNTER — Encounter: Payer: Self-pay | Admitting: Oncology

## 2024-09-23 ENCOUNTER — Ambulatory Visit

## 2024-09-23 VITALS — BP 121/81 | HR 76 | Temp 97.4°F | Resp 19 | Ht 74.0 in | Wt 250.1 lb

## 2024-09-23 DIAGNOSIS — Z8349 Family history of other endocrine, nutritional and metabolic diseases: Secondary | ICD-10-CM

## 2024-09-23 DIAGNOSIS — L7211 Pilar cyst: Secondary | ICD-10-CM | POA: Diagnosis not present

## 2024-09-23 DIAGNOSIS — D485 Neoplasm of uncertain behavior of skin: Secondary | ICD-10-CM

## 2024-09-23 NOTE — Progress Notes (Signed)
 New patient; Ref Delon Mody dx; family history for amylodsis.

## 2024-09-23 NOTE — Progress Notes (Signed)
" °  °  Subjective   Howard Lopez is a 64 y.o. male who presents for the following: Surgical excision of pilar cyst. Pacemaker/Defibrillator: Denies  Allergies: Denies  Anticoagulants/Aspirin: Denies  Heart valves: Denies  Joint replacements:  Denies  Patient is established patient   Today patient reports: Pilar cyst, excision today  Review of Systems:    No other skin or systemic complaints except as noted in HPI or Assessment and Plan.  The following portions of the chart were reviewed this encounter and updated as appropriate: medications, allergies, medical history  Relevant Medical History:  n/a   Objective  (SKPE) Well appearing patient in no apparent distress; mood and affect are within normal limits. Examination was performed of the: Focused Exam of: the scalp   Examination notable for: firm SQ nodule Examination limited by: n/a   Scalp 2cm Subcutaneous nodule at scalp.   Assessment & Plan  (SKAP)   Was sun protection counseling provided?: No   Procedures, orders, diagnosis for this visit:  NEOPLASM OF UNCERTAIN BEHAVIOR OF SKIN Scalp - Skin excision  Excision method:  punch Total excision diameter (cm):  2 Informed consent: discussed and consent obtained   Timeout: patient name, date of birth, surgical site, and procedure verified   Procedure prep:  Patient was prepped and draped in usual sterile fashion Prep type:  Chlorhexidine  Anesthesia: the lesion was anesthetized in a standard fashion   Anesthetic:  1% lidocaine  w/ epinephrine  1-100,000 buffered w/ 8.4% NaHCO3 Instrument used comment:  4mm Punch Hemostasis achieved with: suture, pressure and electrodesiccation   Outcome: patient tolerated procedure well with no complications    - Skin repair Complexity:  Simple Final length (cm):  0.5 Informed consent: discussed and consent obtained   Timeout: patient name, date of birth, surgical site, and procedure verified   Procedure prep:  Patient was  prepped and draped in usual sterile fashion Prep type:  Chlorhexidine  Anesthesia: the lesion was anesthetized in a standard fashion   Anesthetic:  1% lidocaine  w/ epinephrine  1-100,000 buffered w/ 8.4% NaHCO3 Undermining: edges could be approximated without difficulty   Fine/surface layer approximation (top stitches):  Suture size:  5-0 Suture type: Prolene (polypropylene)   Stitches: simple interrupted   Hemostasis achieved with: suture, pressure and electrodesiccation Outcome: patient tolerated procedure well with no complications   Post-procedure details: sterile dressing applied and wound care instructions given   Dressing type: petrolatum, bandage and pressure dressing    Specimen 1 - Surgical pathology Differential Diagnosis: Pilar cyst r/o dysplasia   Check Margins: No  Neoplasm of uncertain behavior of skin -     Skin excision -     Skin repair -     Surgical pathology; Standing   Level of service outlined above   Return to clinic: Return for 7-10 days for suture removal.  I, [name], CMA, am acting as scribe for Lauraine JAYSON Kanaris, MD .   Documentation: I have reviewed the above documentation for accuracy and completeness, and I agree with the above.  Lauraine JAYSON Kanaris, MD  "

## 2024-09-23 NOTE — Patient Instructions (Signed)

## 2024-09-23 NOTE — Progress Notes (Signed)
 "  Hematology/Oncology Consult note [facility] [phone] [phone]  Patient Care Team: Patient, No Pcp Per as PCP - General (General Practice)   Name of the patient: Howard Lopez  [id number]  04-22-1961    Reason for referral-family history of amyloidosis   Referring physician-Dr. Delon Portal  Date of visit: 09/23/24   History of presenting illness- Howard Lopez is a 64 year old male with no significant past medical history who presents for evaluation of personal risk for amyloidosis following his brother's recent death from AL (light chain) amyloidosis.  He expresses concern regarding his risk for amyloidosis after his brother's recent death from AL (light chain) amyloidosis, which was confirmed to be a non-hereditary subtype. He seeks information about his own risk and possible screening options, and requests guidance on red flag symptoms. He is currently asymptomatic, with no leg edema, weight loss, anorexia, dyspnea, neuropathy, or orthostatic symptoms.  He remains active and denies any other health concerns. He is up to date with colonoscopy screening and has no history of malignancy or chronic illness. He does not currently have a primary care provider but is considering establishing care for routine health maintenance.      History of Present Illness    ECOG PS- 0  Pain scale- 0   Review of systems- Review of Systems  Constitutional:  Negative for chills, fever, malaise/fatigue and weight loss.  HENT:  Negative for congestion, ear discharge and nosebleeds.   Eyes:  Negative for blurred vision.  Respiratory:  Negative for cough, hemoptysis, sputum production, shortness of breath and wheezing.   Cardiovascular:  Negative for chest pain, palpitations, orthopnea and claudication.  Gastrointestinal:  Negative for abdominal pain, blood in stool, constipation, diarrhea, heartburn, melena, nausea and vomiting.   Genitourinary:  Negative for dysuria, flank pain, frequency, hematuria and urgency.  Musculoskeletal:  Negative for back pain, joint pain and myalgias.  Skin:  Negative for rash.  Neurological:  Negative for dizziness, tingling, focal weakness, seizures, weakness and headaches.  Endo/Heme/Allergies:  Does not bruise/bleed easily.  Psychiatric/Behavioral:  Negative for depression and suicidal ideas. The patient does not have insomnia.     Allergies[1]  Patient Active Problem List   Diagnosis Date Noted   Colon cancer screening    Polyp of ascending colon    Abdominal pain, epigastric 02/20/2014   Faintness 02/20/2014   Need for Tdap vaccination 02/20/2014     Past Medical History:  Diagnosis Date   Hypertension 01/11/14   @ [facility]   Wears glasses    reading     Past Surgical History:  Procedure Laterality Date   COLONOSCOPY WITH PROPOFOL  N/A 06/07/2021   Procedure: COLONOSCOPY WITH PROPOFOL ;  Surgeon: Jinny Carmine, MD;  Location: [facility] ENDOSCOPY;  Service: Endoscopy;  Laterality: N/A;   FRACTURE SURGERY     HERNIA REPAIR     When pt was 64 years old   ORIF ANKLE FRACTURE Left 01/22/2014   Procedure: OPEN REDUCTION INTERNAL FIXATION (ORIF) LEFT ANKLE FRACTURE/SYNDESMOSIS DISRUPTION;  Surgeon: Norleen Armor, MD;  Location: [facility];  Service: Orthopedics;  Laterality: Left; - Dr. Armor   WISDOM TOOTH EXTRACTION  08/28/1992    Social History   Socioeconomic History   Marital status: Married    Spouse name: Avelina   Number of children: 3   Years of education: 16   Highest education level: Not on file  Occupational History   Occupation: Engineering Geologist: [employer]  Tobacco Use   Smoking status: Never   Smokeless tobacco: Never  Vaping Use   Vaping status: Never Used  Substance and Sexual Activity   Alcohol use: No   Drug use: No   Sexual activity: Yes  Other Topics Concern   Not on file  Social History Narrative    Born and raised in [place], [place]. Attended [employer]  for communications. Currently married to wife Avelina, and has children Alyce (age 37), Carlyon (age 85), Hope (age 13). 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    Social Drivers of Health   Tobacco Use: Low Risk  (05/03/2022)   Received from [facility]   Patient History    Smoking Tobacco Use: Never    Smokeless Tobacco Use: Never    Passive Exposure: Not on file  Financial Resource Strain: Not on file  Food Insecurity: No Food Insecurity (09/23/2024)   Epic    Worried About Programme Researcher, Broadcasting/film/video in the Last Year: Never true    Ran Out of Food in the Last Year: Never true  Transportation Needs: No Transportation Needs (09/23/2024)   Epic    Lack of Transportation (Medical): No    Lack of Transportation (Non-Medical): No  Physical Activity: Not on file  Stress: Not on file  Social Connections: Not on file  Intimate Partner Violence: Not on file  Depression (EYV7-0): Not on file  Alcohol Screen: Not on file  Housing: Low Risk (09/23/2024)   Epic    Unable to Pay for Housing in the Last Year: No    Number of Times Moved in the Last Year: 0    Homeless in the Last Year: No  Utilities: Not At Risk (09/23/2024)   Epic    Threatened with loss of utilities: No  Health Literacy: Not on file     Family History  Problem Relation Age of Onset   Cancer Maternal Grandfather        colon cancer   Heart failure Maternal Grandmother    Heart attack Paternal Grandmother    COPD Paternal Grandfather     Current Medications[2]   Physical exam: There were no vitals filed for this visit. Physical Exam Cardiovascular:     Rate and Rhythm: Normal rate and regular rhythm.     Heart sounds: Normal heart sounds.  Pulmonary:     Effort: Pulmonary effort is normal.     Breath sounds: Normal breath sounds.  Abdominal:     General: Bowel sounds are normal.     Palpations: Abdomen is soft.      Comments: No palpable hepatosplenomegaly  Skin:    General: Skin is warm and dry.  Neurological:     Mental Status: He is alert and oriented to person, place, and time.           Latest Ref Rng & Units 02/20/2014   11:06 AM  CMP  Total Protein 6.0 - 8.3 g/dL 7.7   Total Bilirubin 0.2 - 1.2 mg/dL 0.5   Alkaline Phos 39 - 117 U/L 74   AST 0 - 37 U/L 27   ALT 0 - 53 U/L 38       Latest Ref Rng & Units 01/22/2014    9:37 AM  CBC  Hemoglobin 13.0 - 17.0 g/dL 84.4       Assessment and plan- Patient is a 64 y.o. male referred for family history of amyloidosis  Assessment and Plan  Family history of amyloidosis His brother's AL amyloidosis is not hereditary, posing no genetic risk. He is asymptomatic with no amyloidosis indicators. Routine screening or genetic testing is unwarranted due to non-hereditary nature and disease rarity. - Educated on amyloidosis subtypes, clarified AL amyloidosis is non-hereditary.Some forms of amyloidosis are inherited but most are acquired.  The most common forms of systemic amyloidosis mainly AL light chain amyloidosis and wild-type transthyretin ATTR wt amyloidosis are acquired and not hereditary.  Hereditary transthyretin ATTRv is an autosomal dominant disease and has a 50% chance of passing it to an offspring from an affected parent.  Amyloidosis renal manifestations in about 60 to 70% of the patients presenting with proteinuria and edema and secondary hyperlipidemia cardiac involvement characterized by elevated cardiac troponin and proBNP levels, cardiac arrhythmias and concentric ventricular thickening on echocardiography.  Neurologic symptoms can be seen in amyloidosis including GI motility disorders and neuropathy.  Hepatomegaly macroglossia easy bruising fatigue weight loss of some of the other symptoms.  - Advised against genetic testing and periodic screening based on family history. - Instructed to monitor for symptoms: peripheral edema,  anorexia, weight loss, dyspnea, neuropathy, blood pressure changes, and seek medical attention if he develops. - Recommended primary care physician for routine health maintenance and cancer screening: colonoscopy, prostate cancer screening, lipid panel. - Discussed unnecessary testing limitations and downsides, including whole-body MRI and incidental findings. - No lab or urine studies ordered; he declined further testing after discussion.        Thank you for this kind referral and the opportunity to participate in the care of this patient   Visit Diagnosis 1. Family history of amyloidosis     Dr. Annah Skene, MD, MPH [facility] [phone] 09/23/2024                   [1] No Known Allergies [2]  Current Outpatient Medications:    cyclobenzaprine  (FLEXERIL ) 5 MG tablet, 1 pill by mouth up to every 8 hours as needed. Start with one pill by mouth each bedtime as needed due to sedation (Patient not taking: Reported on 09/23/2024), Disp: 15 tablet, Rfl: 0   ibuprofen  (ADVIL ,MOTRIN ) 800 MG tablet, Take 1 tablet (800 mg total) by mouth 3 (three) times daily. (Patient not taking: Reported on 09/23/2024), Disp: 21 tablet, Rfl: 0  "

## 2024-09-24 LAB — SURGICAL PATHOLOGY

## 2024-09-25 ENCOUNTER — Ambulatory Visit: Payer: Self-pay

## 2024-09-25 NOTE — Progress Notes (Signed)
Patient informed and voiced good understanding.

## 2024-09-30 ENCOUNTER — Encounter

## 2024-10-01 ENCOUNTER — Ambulatory Visit

## 2024-10-01 DIAGNOSIS — Z4802 Encounter for removal of sutures: Secondary | ICD-10-CM

## 2024-10-01 DIAGNOSIS — Z48817 Encounter for surgical aftercare following surgery on the skin and subcutaneous tissue: Secondary | ICD-10-CM

## 2024-10-01 NOTE — Progress Notes (Signed)
" °  °  Subjective   Howard Lopez is a 64 y.o. male who presents for the following: 7-10 day suture removal. Patient is established patient   Today patient reports: Patient here for suture removal at scalp.   Review of Systems:    No other skin or systemic complaints except as noted in HPI or Assessment and Plan.  The following portions of the chart were reviewed this encounter and updated as appropriate: medications, allergies, medical history  Relevant Medical History:  n/a   Objective  (SKPE) Well appearing patient in no apparent distress; mood and affect are within normal limits. Examination was performed of the: Focused Exam of: Scalp   Examination notable for: - Well healing excision site  Examination limited by: Undergarments, Shoes or socks , and Clothing     Assessment & Plan  (SKAP)   Encounter for Removal of Sutures - Incision site at the scalp is clean, dry and intact - Wound cleansed, sutures removed, wound cleansed. - Discussed pathology results showing pilar cyst  - Patient advised to keep steri-strips dry until they fall off. - Scars remodel for a full year. - Patient can apply over-the-counter silicone scar cream each night to help with scar remodeling if desired. - Patient advised to call with any concerns or if they notice any new or changing lesions.    Was sun protection counseling provided?: No   Level of service outlined above   Patient instructions (SKPI)   Procedures, orders, diagnosis for this visit:  ENCOUNTER FOR REMOVAL OF SUTURES    Encounter for removal of sutures    Return to clinic: Return if symptoms worsen or fail to improve.  I, Almetta Nora, RMA, am acting as scribe for Lauraine JAYSON Kanaris, MD .   Documentation: I have reviewed the above documentation for accuracy and completeness, and I agree with the above.  Lauraine JAYSON Kanaris, MD  "

## 2024-10-01 NOTE — Patient Instructions (Signed)

## 2024-12-02 ENCOUNTER — Ambulatory Visit: Admitting: Nurse Practitioner
